# Patient Record
Sex: Male | Born: 1960 | Race: White | Hispanic: No
Health system: Midwestern US, Community
[De-identification: ages and names within clinical notes are randomized; demographics above are authoritative.]

## PROBLEM LIST (undated history)

## (undated) DIAGNOSIS — N289 Disorder of kidney and ureter, unspecified: Secondary | ICD-10-CM

## (undated) DIAGNOSIS — M1990 Unspecified osteoarthritis, unspecified site: Secondary | ICD-10-CM

## (undated) DIAGNOSIS — C801 Malignant (primary) neoplasm, unspecified: Secondary | ICD-10-CM

## (undated) DIAGNOSIS — E119 Type 2 diabetes mellitus without complications: Secondary | ICD-10-CM

## (undated) DIAGNOSIS — K859 Acute pancreatitis without necrosis or infection, unspecified: Secondary | ICD-10-CM

## (undated) DIAGNOSIS — K5792 Diverticulitis of intestine, part unspecified, without perforation or abscess without bleeding: Secondary | ICD-10-CM

## (undated) HISTORY — PX: APPENDECTOMY: SHX54

---

## 2008-07-14 NOTE — ED Provider Notes (Unsigned)
PATIENT NAME                  PA #             MR #                  Adam Thompson, Adam Thompson                  [id number]       [id number]            EMERGENCY ROOM PHYSICIAN                 ADM DATE                     Achille Rich, MD                     07/14/2008                   DATE OF BIRTH    AGE            PATIENT TYPE      RM #               [birth year]        47            ERK                                     CHIEF COMPLAINT:  Back pain.       HISTORY OF PRESENT ILLNESS:  A 48 year old male presents to the emergency  room complaining of pain in his lower back.  Patient states he had a long  history of sciatica.  Does not have any recent injury.  Denies any bladder or  bowel incontinence.  Denies any other complaints.       PAST MEDICAL HISTORY:  As noted above.       ALLERGIES:  CODEINE, DARVOCET.       PHYSICAL EXAMINATION:    GENERAL APPEARANCE:  Alert male.    BACK:  Mild paraspinous lumbar pain.  No pain to palpation in the midline.    EXTREMITIES:  Deep tendon reflexes including patellar and Achilles 2/4+,  equal bilaterally.    NEUROLOGICAL:  The patients gait is normal.       IMPRESSION:  Low back pain.       PLAN:  Percocet for pain.  Patient is to follow up with family doctor as  needed.                                  Achille Rich, MD     Adam Thompson [place] /[id number]  DD: 07/14/2008 12:24  DT: 07/15/2008 14:18  Job #: [id number]  CC:

## 2008-07-20 NOTE — ED Provider Notes (Unsigned)
PATIENT NAME                  PA #             MR #                  Adam Thompson, Adam Thompson                  [id number]       [id number]            EMERGENCY ROOM PHYSICIAN                 ADM DATE                     Chesley Mires, MD                        07/20/2008                   DATE OF BIRTH    AGE            PATIENT TYPE      RM #               25-May-1960        47            ERK                                     CHIEF COMPLAINT:  Back pain.       HISTORY OF PRESENT ILLNESS:  This 48 year old presents to the ER complaining  of lower back pain.  He was seen here a week ago with the same complaints.   He said he lives in [place].  He is here on a job for about another  week.  He has pain in his back intermittently.  He is not on any medicine  chronically, he claims.  He is out of the Percocet he was given here last  week and he requested more pain medication.  He complains of lower back pain  without radiation.  No bladder or bowel dysfunction.  No recent history of  trauma.       PHYSICAL EXAMINATION:    GENERAL APPEARANCE:  Objective exam revealed a 48 year old in no distress.    VITAL SIGNS:  Blood pressure 137/90, pulse 77, respirations 20, temperature  97.2.    HEENT:  Normal.    CHEST:  Clear.    HEART:  Regular.    BACK:  He complained of pain slightly to the left side of his lower lumbar  spine.    EXTREMITIES:  On lower extremity exam, deep tendon reflexes all 2+ and  symmetric.  Motor exam is normal.  Straight leg raising was negative.       IMPRESSION:  Exacerbation of chronic back pain.       PLAN:  He will be discharged with a prescription for #20 Lortab.  He was to  follow up with his family doctor.  I told him we could no longer prescribe  him medications out of the emergency room for this problem, and he needed to  follow up with a family doctor.  Chesley Mires, MD     KN /[id number]  DD: 07/20/2008 08:49  DT: 07/21/2008 17:20  Job #: [id number]  CC:

## 2011-08-19 ENCOUNTER — Emergency Department: Payer: Self-pay | Admitting: Emergency Medicine

## 2011-08-19 LAB — COMPREHENSIVE METABOLIC PANEL
Anion Gap: 5 — ABNORMAL LOW (ref 7–16)
Calcium, Total: 9.8 mg/dL (ref 8.5–10.1)
Chloride: 106 mmol/L (ref 98–107)
Co2: 33 mmol/L — ABNORMAL HIGH (ref 21–32)
Creatinine: 0.88 mg/dL (ref 0.60–1.30)
Osmolality: 285 (ref 275–301)
Potassium: 3.4 mmol/L — ABNORMAL LOW (ref 3.5–5.1)
Sodium: 144 mmol/L (ref 136–145)
Total Protein: 7.2 g/dL (ref 6.4–8.2)

## 2011-08-19 LAB — TROPONIN I: Troponin-I: <0.02 ng/mL

## 2011-08-19 LAB — URINALYSIS, COMPLETE
Bilirubin,UR: NEGATIVE
Blood: NEGATIVE
Leukocyte Esterase: NEGATIVE
Nitrite: NEGATIVE
Ph: 5 (ref 4.5–8.0)
Protein: NEGATIVE
RBC,UR: NONE SEEN /HPF (ref 0–5)

## 2011-08-19 LAB — CBC
HCT: 39.1 % — ABNORMAL LOW (ref 40.0–52.0)
MCH: 33.3 pg (ref 26.0–34.0)
MCHC: 34.1 g/dL (ref 32.0–36.0)
Platelet: 191 10*3/uL (ref 150–440)
RBC: 4 10*6/uL — ABNORMAL LOW (ref 4.40–5.90)

## 2011-08-19 LAB — LIPASE, BLOOD: Lipase: 130 U/L (ref 73–393)

## 2011-08-22 ENCOUNTER — Emergency Department: Payer: Self-pay | Admitting: *Deleted

## 2011-08-22 LAB — COMPREHENSIVE METABOLIC PANEL
Albumin: 3.7 g/dL (ref 3.4–5.0)
Alkaline Phosphatase: 61 U/L (ref 50–136)
Anion Gap: 7 (ref 7–16)
Co2: 30 mmol/L (ref 21–32)
Creatinine: 0.9 mg/dL (ref 0.60–1.30)
EGFR (African American): >60
Glucose: 117 mg/dL — ABNORMAL HIGH (ref 65–99)
Osmolality: 284 (ref 275–301)
SGOT(AST): 52 U/L — ABNORMAL HIGH (ref 15–37)
SGPT (ALT): 60 U/L
Sodium: 142 mmol/L (ref 136–145)
Total Protein: 6.5 g/dL (ref 6.4–8.2)

## 2011-08-22 LAB — CBC
HCT: 37.8 % — ABNORMAL LOW (ref 40.0–52.0)
HGB: 13.1 g/dL (ref 13.0–18.0)
MCHC: 34.5 g/dL (ref 32.0–36.0)
MCV: 98 fL (ref 80–100)
RBC: 3.87 10*6/uL — ABNORMAL LOW (ref 4.40–5.90)
WBC: 6.2 10*3/uL (ref 3.8–10.6)

## 2011-08-22 LAB — TROPONIN I: Troponin-I: <0.02 ng/mL

## 2013-02-22 ENCOUNTER — Emergency Department: Payer: Self-pay | Admitting: Emergency Medicine

## 2013-02-22 LAB — CBC WITH DIFFERENTIAL/PLATELET
Basophil #: 0.1 10*3/uL (ref 0.0–0.1)
Basophil %: 0.9 %
Eosinophil %: 4.2 %
MCHC: 33.3 g/dL (ref 32.0–36.0)
MCV: 95 fL (ref 80–100)
Monocyte #: 0.5 x10 3/mm (ref 0.2–1.0)
Monocyte %: 8.8 %
Neutrophil #: 3 10*3/uL (ref 1.4–6.5)
Neutrophil %: 51.5 %
Platelet: 201 10*3/uL (ref 150–440)
RBC: 4.21 10*6/uL — ABNORMAL LOW (ref 4.40–5.90)
RDW: 13.6 % (ref 11.5–14.5)
WBC: 5.9 10*3/uL (ref 3.8–10.6)

## 2013-02-22 LAB — URINALYSIS, COMPLETE
Bacteria: NONE SEEN
Blood: NEGATIVE
Leukocyte Esterase: NEGATIVE
Ph: 6 (ref 4.5–8.0)
RBC,UR: NONE SEEN /HPF (ref 0–5)
Specific Gravity: 1.006 (ref 1.003–1.030)
WBC UR: NONE SEEN /HPF (ref 0–5)

## 2013-02-22 LAB — COMPREHENSIVE METABOLIC PANEL
Albumin: 4 g/dL (ref 3.4–5.0)
Alkaline Phosphatase: 65 U/L
Anion Gap: 3 — ABNORMAL LOW (ref 7–16)
BUN: 8 mg/dL (ref 7–18)
Bilirubin,Total: 0.4 mg/dL (ref 0.2–1.0)
Chloride: 106 mmol/L (ref 98–107)
Co2: 30 mmol/L (ref 21–32)
Creatinine: 0.82 mg/dL (ref 0.60–1.30)
EGFR (African American): >60
EGFR (Non-African Amer.): >60
Osmolality: 276 (ref 275–301)
Potassium: 3.7 mmol/L (ref 3.5–5.1)
SGOT(AST): 58 U/L — ABNORMAL HIGH (ref 15–37)
Total Protein: 7.6 g/dL (ref 6.4–8.2)

## 2013-02-22 LAB — LIPASE, BLOOD: Lipase: 180 U/L (ref 73–393)

## 2013-05-02 IMAGING — CT CT ABD-PELV W/ CM
1 of 2 series · 15 of 32 positions shown, 19 images · IV contrast (isovue)
Comparison: none

REASON FOR EXAM: (1) right abd  pain  vomiting yest better today; (2) same
COMMENTS:

PROCEDURE:     CT  - CT ABDOMEN / PELVIS  W  - August 19, 2011 [DATE]
RESULT:     Comparison:  None
TECHNIQUE: Multiple axial images of the abdomen and pelvis were performed
from the lung bases to the pubic symphysis, with p.o. contrast and with 100
ml of Isovue 300 intravenous contrast.

[Series 2: 3mm soft tissue · axial · 0.65mm/px · z∈[-941,-545]mm · 15 of 146 slices shown, 19 images]
[im 7/146  soft-tissue]
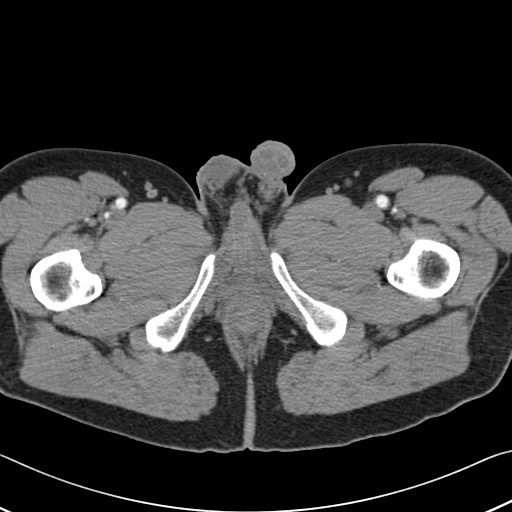
[im 7/146  bone]
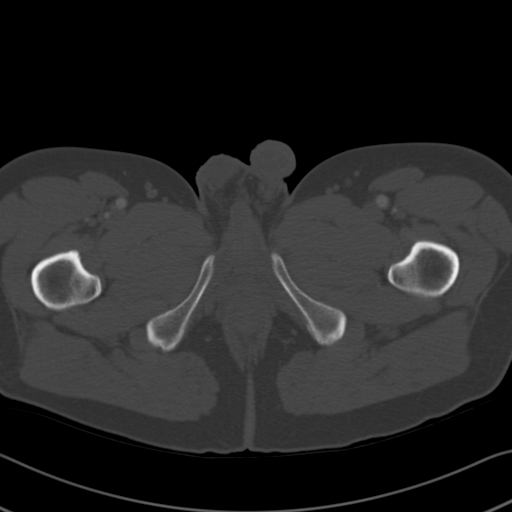
[im 19/146  soft-tissue]
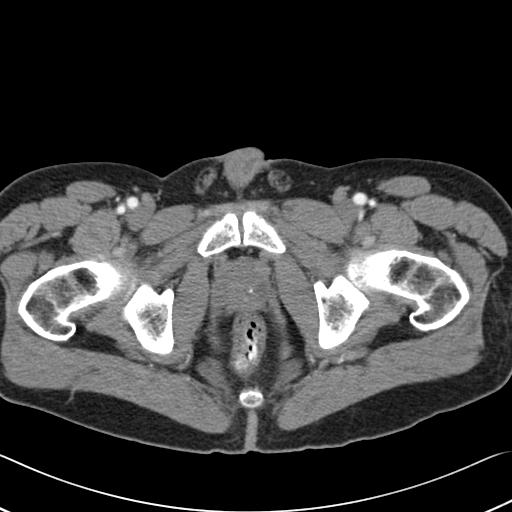
[im 31/146  soft-tissue]
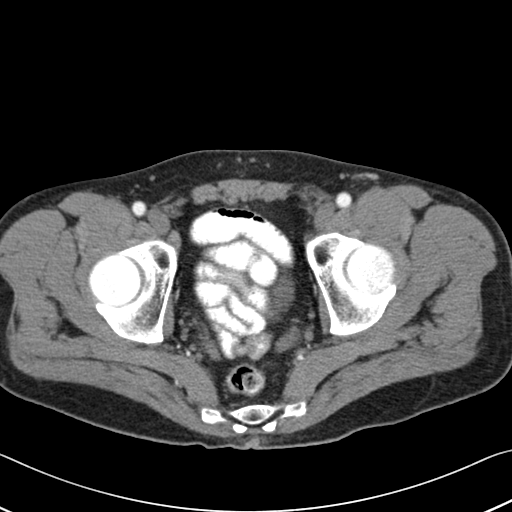
[im 43/146  soft-tissue]
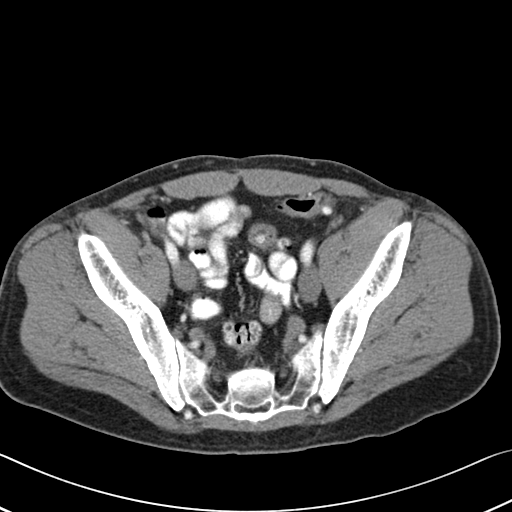
[im 49/146  soft-tissue]
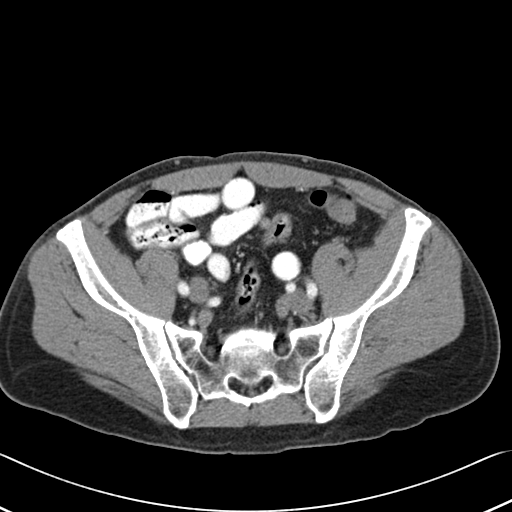
[im 61/146  soft-tissue]
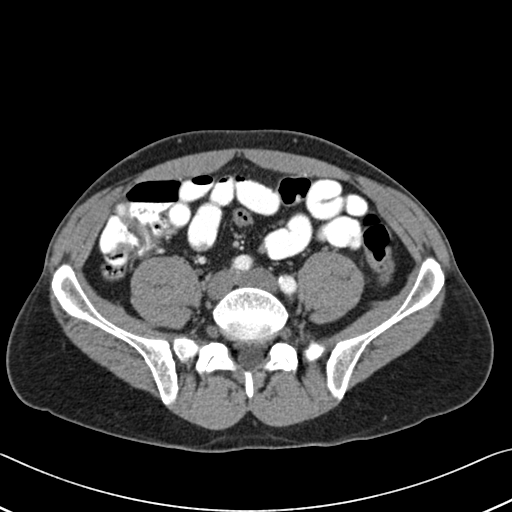
[im 73/146  soft-tissue]
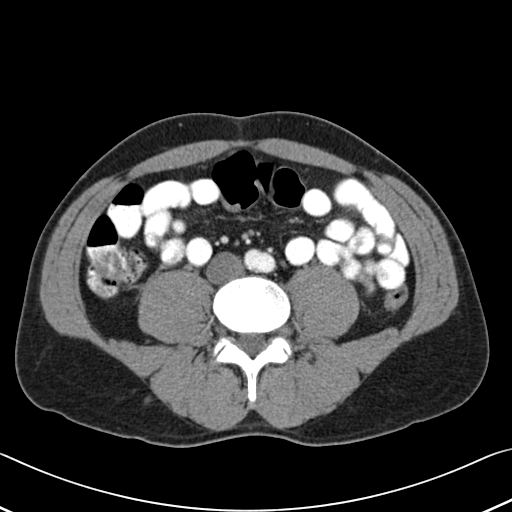
[im 85/146  soft-tissue]
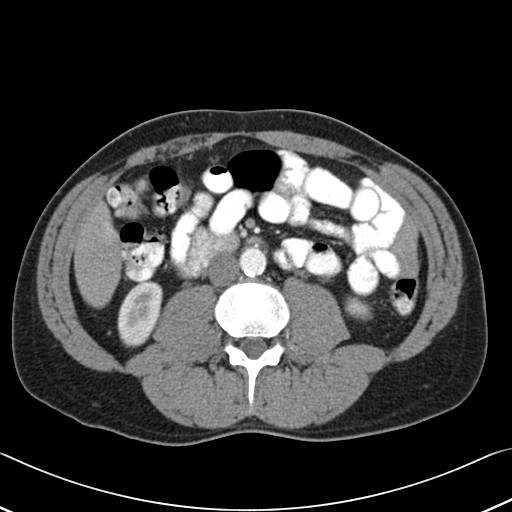
[im 97/146  soft-tissue]
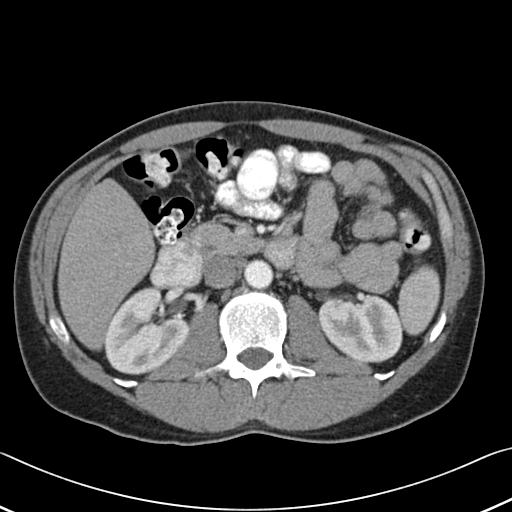
[im 97/146  bone]
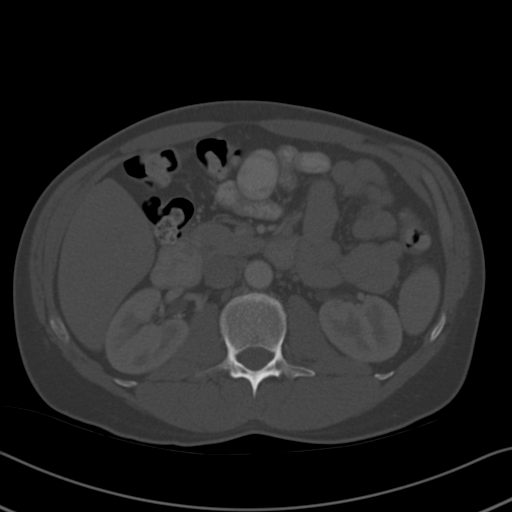
[im 103/146  soft-tissue]
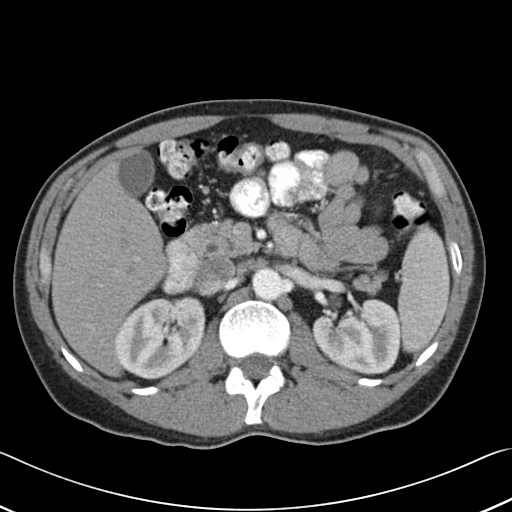
[im 115/146  soft-tissue]
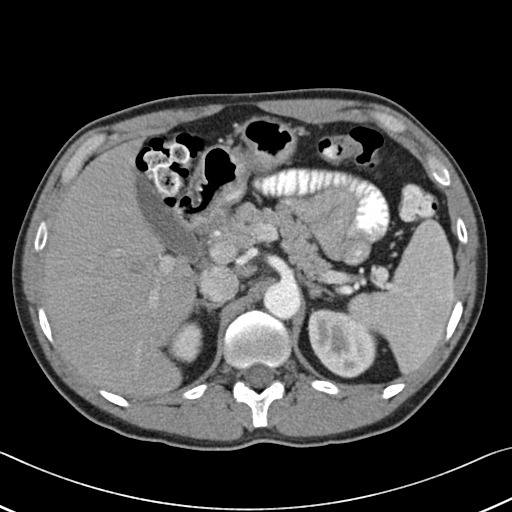
[im 121/146  lung]
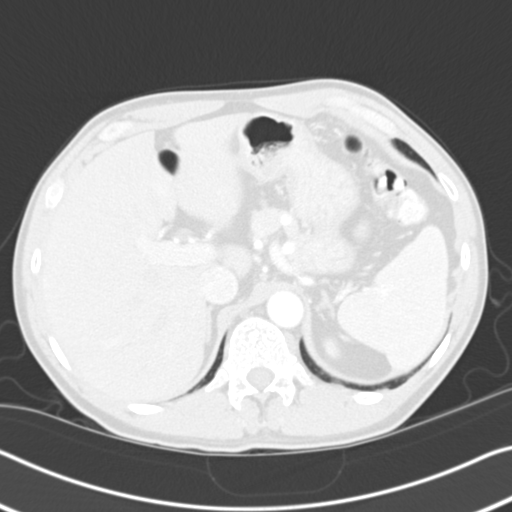
[im 127/146  soft-tissue]
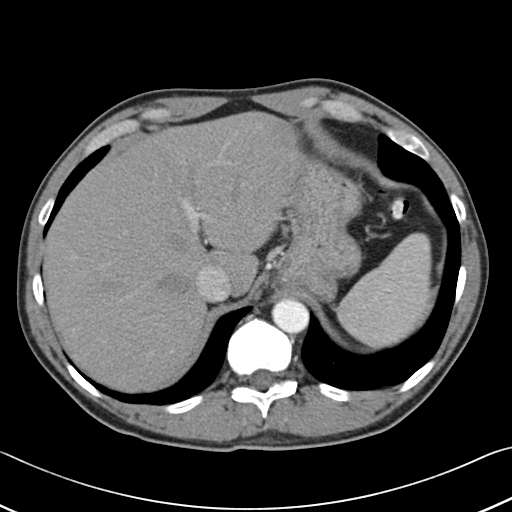
[im 127/146  lung]
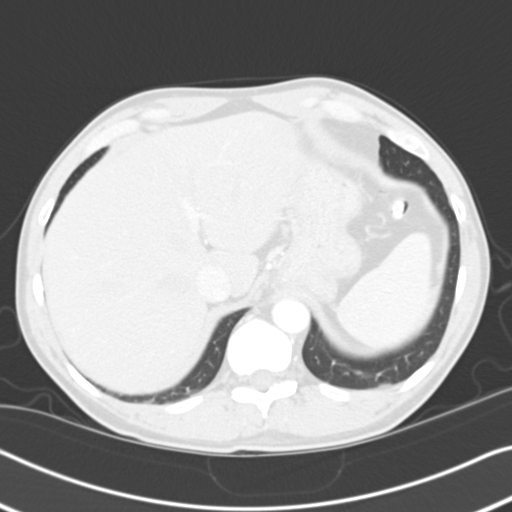
[im 133/146  lung]
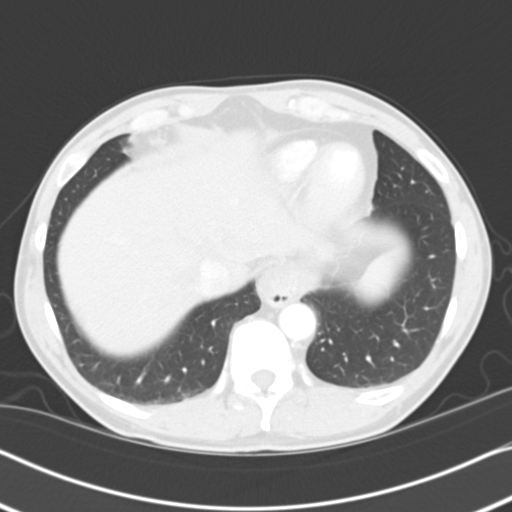
[im 139/146  soft-tissue]
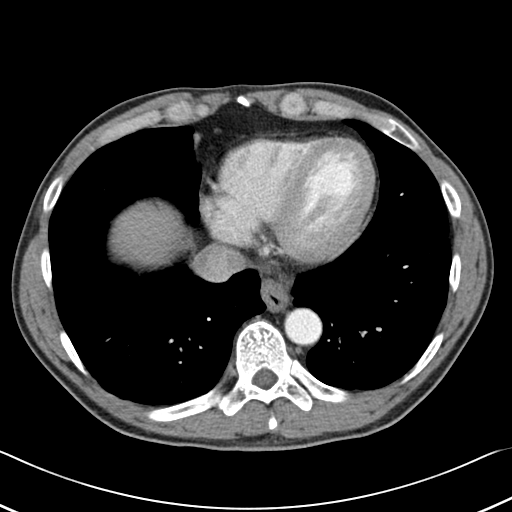
[im 139/146  lung]
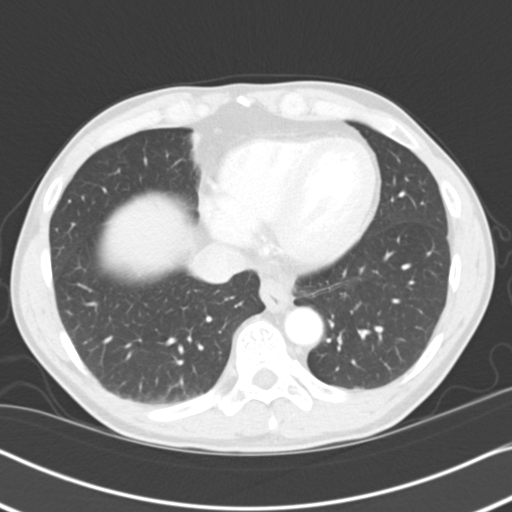

[15 of 32 positions shown; findings below may reference images not displayed]

FINDINGS: The lung bases are clear. There is no pneumothorax. The heart size is
normal.

The liver demonstrates no focal abnormality. There is no intrahepatic or
extrahepatic biliary ductal dilatation. The gallbladder is unremarkable. The
spleen demonstrates no focal abnormality. The kidneys, adrenal glands, and
pancreas are normal. The bladder is unremarkable.

The stomach, duodenum, small intestine, and large intestine demonstrate no
contrast extravasation or dilatation.  There is no pneumoperitoneum,
pneumatosis, or portal venous gas. There is no abdominal or pelvic free
fluid. There is no lymphadenopathy.

The abdominal aorta is normal in caliber .

The osseous structures are unremarkable.
IMPRESSION: 1. No acute abdominal or pelvic pathology.

[REDACTED]

## 2017-01-11 DIAGNOSIS — N433 Hydrocele, unspecified: Secondary | ICD-10-CM

## 2017-01-11 NOTE — ED Provider Notes (Signed)
THE [facility]  EMERGENCY DEPARTMENT ENCOUNTER          ATTENDING PHYSICIAN NOTE       Date of evaluation: 01/11/2017    Chief Complaint     Scrotal Pain (states right testicle is larger than left and painful states)      History of Present Illness     Adam Thompson is a 56 y.o. male who presents to the emergency department with right testicular pain.  Patient states he has been having tenderness to the testicle starting several hours ago.  Patient denies any urinary symptoms.  He denies having anything like this in the past.  He was seen at [facility] and had a negative urinalysis.  He was transferred to this facility to obtain ultrasound to rule out testicular torsion.  On arrival, the patient does complain of continuing pain in the right testicle.    Review of Systems     Review of Systems   Constitutional: Negative.    HENT: Negative.    Eyes: Negative.    Respiratory: Negative.    Cardiovascular: Negative.    Gastrointestinal: Negative.    Genitourinary: Positive for scrotal swelling and testicular pain.   Musculoskeletal: Negative.    Neurological: Negative.    All other systems reviewed and are negative.      Past Medical, Surgical, Family, and Social History     He has a past medical history of Arthritis; Colitis; Diverticulitis; Mesothelioma (HCC); and Pancreatitis.  He has a past surgical history that includes Appendectomy.  His family history is not on file.  He reports that he has been smoking.  He does not have any smokeless tobacco history on file. He reports that he drinks alcohol. He reports that he uses drugs, including Marijuana.    Medications     Discharge Medication List as of 01/12/2017  1:30 AM          Allergies     He has No Known Allergies.    Physical Exam     INITIAL VITALS: BP: (!) 183/103, Temp: 98.6 ??F (37 ??C), Pulse: 87, Resp: 12, SpO2: 100 %   Physical Exam   Constitutional: He appears well-developed and well-nourished. No distress.   Abdominal: Soft. Bowel sounds are  normal. There is no tenderness. There is no rebound and no guarding.   Genitourinary: Penis normal. Right testis shows swelling and tenderness. Cremasteric reflex is not absent on the right side.   Genitourinary Comments: Patient does have mild erythema and swelling to the scrotum.  The right testicle is tender but is normal in lie.   Nursing note and vitals reviewed.      Diagnostic Results     RADIOLOGY:  US SCROTUM AND TESTICLES   Final Result     1.  No evidence of testicular torsion.     2.  Findings likely represent right-sided epididymoorchitis.     3.  Moderate complex right hydrocele          [place] DUP ABD PEL RETRO SCROT COMPLETE    (Results Pending)       LABS:   Results for orders placed or performed during the hospital encounter of 01/11/17   Urine Reflex to Culture   Result Value Ref Range    Color, UA Yellow Straw/Yellow    Clarity, UA Clear Clear    Glucose, Ur Negative Negative mg/dL    Bilirubin Urine Negative Negative    Ketones, Urine Negative Negative mg/dL  Specific Gravity, UA <=1.005 1.005 - 1.030    Blood, Urine Negative Negative    pH, UA 6.5 5.0 - 8.0    Protein, UA Negative Negative mg/dL    Urobilinogen, Urine 0.2 <2.0 E.U./dL    Nitrite, Urine Negative Negative    Leukocyte Esterase, Urine Negative Negative    Microscopic Examination Not Indicated     Urine Reflex to Culture Not Indicated     Urine Type Not Specified      RECENT VITALS:  BP: 130/70,Temp: 98.6 ??F (37 ??C), Pulse: 72, Resp: 18, SpO2: 100 %     Procedures     N/A    ED Course     Nursing Notes, Past Medical Hx, Past Surgical Hx, Social Hx,Allergies, and Family Hx were reviewed.    The patient was given the following medications:  Orders Placed This Encounter   Medications   ??? traMADol (ULTRAM) tablet 50 mg   ??? acetaminophen (TYLENOL) tablet 1,000 mg   ??? oxyCODONE (ROXICODONE) immediate release tablet 5 mg   ??? ciprofloxacin (CIPRO) tablet 500 mg   ??? ciprofloxacin (CIPRO) 500 MG tablet     Sig: Take 1 tablet by mouth 2 times  daily for 14 days     Dispense:  28 tablet     Refill:  0   ??? HYDROcodone-acetaminophen (NORCO) 5-325 MG per tablet     Sig: Take 1 tablet by mouth every 6 hours as needed for Pain for up to 3 days. Intended supply: 3 days. Take lowest dose possible to manage pain.     Dispense:  12 tablet     Refill:  0       CONSULTS:  None    MEDICAL DECISIONMAKING / ASSESSMENT / PLAN     Adam Thompson is a 56 y.o. male presents to the emergency department complaining of acute onset of right testicular pain and swelling.  Patient does have an erythematous scrotum with testicular tenderness on exam though the testicle is normal in lie.  Urinalysis shows no evidence of infection.  Ultrasound shows no evidence of testicular torsion but does show increased blood flow consistent with epididyoorchitis as well as a moderate complex right hydrocele.  The patient will be treated with Cipro since he states he is not sexually active.  He will be given a small amount of Norco for pain.  He will be instructed to keep scrotum elevated when possible.  He will be instructed to follow-up with the urologist on call for further evaluation and management.    Clinical Impression     1. Orchitis and epididymitis    2. Hydrocele, unspecified hydrocele type        Disposition     PATIENT REFERRED TO:  Sharlette Dense, MD  [address]  [facility]  [place] [address]  [phone]            DISCHARGE MEDICATIONS:  Discharge Medication List as of 01/12/2017  1:30 AM      START taking these medications    Details   ciprofloxacin (CIPRO) 500 MG tablet Take 1 tablet by mouth 2 times daily for 14 days, Disp-28 tablet, R-0Print      HYDROcodone-acetaminophen (NORCO) 5-325 MG per tablet Take 1 tablet by mouth every 6 hours as needed for Pain for up to 3 days. Intended supply: 3 days. Take lowest dose possible to manage pain., Disp-12 tablet,  R-0Print             DISPOSITION Decision To Discharge 01/12/2017 01:29:34  AM        Leota Sauers, MD  01/12/17 [phone]

## 2017-01-11 NOTE — ED Notes (Signed)
Called boss to take to Alvester Morin, RN  01/11/17 [phone]

## 2017-01-11 NOTE — ED Provider Notes (Signed)
[facility] Emergency Department  01/11/17     Patient Identification  Adam Thompson is a 56 y.o. male    Chief Complaint  Scrotal Pain (states right testicle is larger than left and painful states)      Mode of Arrival  private car (dropped off)    HPI  (History provided by patient)  This is a 56 y.o. male without significant past medical history presented today for right scrotal pain and swelling.  Patient states he woke up today with ongoing discomfort in his right scrotum.  He states is a day one on his right scrotum became more swollen and painful.  He states his right testicle is larger than the left now and is usually.  Took numerous nonsteroidal pain medications without relief..  No previous history of hernia and previous history of epididymitis.    ROS  10 systems reviewed and negative except as in HPI    I have reviewed the following nursing documentation:  Allergies: No Known Allergies    Past medical history:  has no past medical history on file.    Past surgical history:  has no past surgical history on file.    Home medications:   Prior to Admission medications    Not on File       Social history:      Family history:  No family history on file.    Exam  ED Triage Vitals [01/11/17 2232]   BP Temp Temp Source Pulse Resp SpO2 Height Weight   (!) 183/103 98.6 F (37 C) Oral 87 12 100 % 5\' 5"  (1.651 m) 142 lb 6.4 oz (64.6 kg)        Nursing notes and vital signs reviewed    Constitutional - patient oriented to person, place, time.  Well-developed well-nourished.  Nontoxic.  In Mild  distress.    HEENT  Head- normocephalic, atraumatic  Eyes-anicteric sclera, PERRL, no discharge  Ears-external canals normal  Throat-clear of exudate, no erythema  Mouth-membranes mucosa moist and pink    Lymphatic-  No significant lymphadenopathy noted in cervical, submandibular, auricular or inguinal regions    Neck-supple, trachea midline.  No meningismus.    Cardiovascular-regular rate and rhythm, no gallops rubs or murmurs,  2+ radial pulses    Pulmonary/Chest-  effort normal.  No respiratory distress.  Clear to auscultation bilaterally, no stridor, no wheezes, no rales    Abdomen- soft nondistended.  No tenderness.  No rebound or guarding.  Bowel sounds normal.  No masses.    Musculoskeletal- no extremity edema.  Compartments soft.  No deformity.  No tenderness in the extremities.  Back-no tenderness over the bony spine.    Neurologic- alert and oriented to person place and time.  No facial droop.  No slurred speech.  Motor intact in all 4 extremities.  Normal muscle tone.  Gait normal.    Skin- warm and dry, no rash.  No petechiae.    Psychiatric- normal mood and affect.  Genital exam normal male external genitalia with swelling in the right scrotum.  He has a tense swelling in the right scrotum.  They testicle is ill-defined.  It is tender to palpation.  I feel no hernia in the inguinal ring.  Possibility of torsion also possibility of hydrocele or hematocele or epididymitis exists.  Needs ultrasound to rule out torsion        MDM/ED Course  Orders Placed This Encounter   Medications   . traMADol (ULTRAM) tablet 50 mg  Radiology  No results found.        Labs  Results for orders placed or performed during the hospital encounter of 01/11/17   Urine Reflex to Culture   Result Value Ref Range    Color, UA Yellow Straw/Yellow    Clarity, UA Clear Clear    Glucose, Ur Negative Negative mg/dL    Bilirubin Urine Negative Negative    Ketones, Urine Negative Negative mg/dL    Specific Gravity, UA <=1.005 1.005 - 1.030    Blood, Urine Negative Negative    pH, UA 6.5 5.0 - 8.0    Protein, UA Negative Negative mg/dL    Urobilinogen, Urine 0.2 <2.0 E.U./dL    Nitrite, Urine Negative Negative    Leukocyte Esterase, Urine Negative Negative    Microscopic Examination Not Indicated     Urine Reflex to Culture Not Indicated     Urine Type Not Specified        [facility]. We thoroughly discussed the  history, physical exam, laboratory and imaging studies, as well as, emergency department course. Based upon that discussion, we've decided to Transfer the [facility] to [facility] emergency department for emergent ultrasound to rule out torsion.  Dr. Orvan Falconerampbell graciously accepted the patient for transfer    Final Impression    1. Painful scrotum        Blood pressure 130/70, pulse 72, temperature 98.6 F (37 C), temperature source Oral, resp. rate 18, height 5\' 5"  (1.651 m), weight 64.6 kg (142 lb 6.4 oz), SpO2 100 %.    Disposition:  Transfer to [facility] to ED in stable condition.by POV          This chart was generated in part by using Dragon Dictation system and may contain errors related to that system including errors in grammar, punctuation, and spelling, as well as words and phrases that may be inappropriate. If there are any questions or concerns please feel free to contact the dictating provider for clarification.         Erroll Lunaobert B Kaarin Pardy, MD  01/11/17 2312

## 2017-01-11 NOTE — ED Triage Notes (Signed)
States today had pain in right testicle hurts and is larger states did lift paint and climb ladder

## 2017-01-11 NOTE — ED Notes (Signed)
Pt signed emtala and d/c instructions. Packet of info given to pt with instructions on how to get to [facility]     Mahamadou Weltz, RN  01/11/17 2306

## 2017-01-12 ENCOUNTER — Emergency Department: Admit: 2017-01-12 | Primary: Pulmonary Disease

## 2017-01-12 ENCOUNTER — Inpatient Hospital Stay
Admit: 2017-01-12 | Discharge: 2017-01-12 | Disposition: A | Discharge status: Home / Self Care | Attending: Emergency Medicine

## 2017-01-12 LAB — URINALYSIS WITH REFLEX TO CULTURE
Bilirubin Urine: NEGATIVE
Blood, Urine: NEGATIVE
Glucose, Ur: NEGATIVE mg/dL
Ketones, Urine: NEGATIVE mg/dL
Leukocyte Esterase, Urine: NEGATIVE
Nitrite, Urine: NEGATIVE
Protein, UA: NEGATIVE mg/dL
Specific Gravity, UA: <=1.005 (ref 1.005–1.030)
Urobilinogen, Urine: 0.2 E.U./dL (ref <2.0)
pH, UA: 6.5 (ref 5.0–8.0)

## 2017-01-12 MED ORDER — CIPROFLOXACIN HCL 500 MG PO TABS
500 MG | ORAL_TABLET | Freq: Two times a day (BID) | ORAL | 0 refills | Status: AC
Start: 2017-01-12 — End: 2017-01-26

## 2017-01-12 MED ORDER — HYDROCODONE-ACETAMINOPHEN 5-325 MG PO TABS
5-325 MG | ORAL_TABLET | Freq: Four times a day (QID) | ORAL | 0 refills | Status: AC | PRN
Start: 2017-01-12 — End: 2017-01-15

## 2017-01-12 MED ORDER — ACETAMINOPHEN 500 MG PO TABS
500 MG | Freq: Once | ORAL | Status: DC
Start: 2017-01-12 — End: 2017-01-12

## 2017-01-12 MED ORDER — CIPROFLOXACIN HCL 500 MG PO TABS
500 MG | Freq: Once | ORAL | Status: AC
Start: 2017-01-12 — End: 2017-01-12
  Administered 2017-01-12: 06:00:00 500 mg via ORAL

## 2017-01-12 MED ORDER — TRAMADOL HCL 50 MG PO TABS
50 MG | Freq: Once | ORAL | Status: DC
Start: 2017-01-12 — End: 2017-01-12

## 2017-01-12 MED ORDER — OXYCODONE HCL 5 MG PO TABS
5 MG | Freq: Once | ORAL | Status: AC
Start: 2017-01-12 — End: 2017-01-12
  Administered 2017-01-12: 06:00:00 5 mg via ORAL

## 2017-01-12 MED FILL — OXYCODONE HCL 5 MG PO TABS: 5 MG | ORAL | Qty: 1

## 2017-01-12 MED FILL — TYLENOL EXTRA STRENGTH 500 MG PO TABS: 500 MG | ORAL | Qty: 2

## 2017-01-12 MED FILL — CIPROFLOXACIN HCL 500 MG PO TABS: 500 MG | ORAL | Qty: 1

## 2017-01-12 MED FILL — TRAMADOL HCL 50 MG PO TABS: 50 MG | ORAL | Qty: 1

## 2017-01-12 NOTE — ED Notes (Signed)
Dc inst and scripts given to pt , verb understanding.  Dc'd ambulatory to lobby to call for ride to home     Veleta Minersonya D Caily Rakers, RN  01/12/17 [phone]

## 2017-01-12 NOTE — Discharge Instructions (Addendum)
Take antibiotics as prescribed until bottle is empty.  Use ibuprofen as needed for pain, Norco for breakthrough pain.  Keep scrotum elevated when possible.  Follow-up with the urologist on call for repeat evaluation and further management.

## 2017-07-08 ENCOUNTER — Encounter: Payer: Self-pay | Admitting: Emergency Medicine

## 2017-07-08 ENCOUNTER — Other Ambulatory Visit: Payer: Self-pay

## 2017-07-08 ENCOUNTER — Emergency Department
Admission: EM | Admit: 2017-07-08 | Discharge: 2017-07-08 | Disposition: A | Discharge status: Home / Self Care | Payer: Self-pay | Attending: Emergency Medicine | Admitting: Emergency Medicine

## 2017-07-08 DIAGNOSIS — F17213 Nicotine dependence, cigarettes, with withdrawal: Secondary | ICD-10-CM | POA: Insufficient documentation

## 2017-07-08 DIAGNOSIS — G8929 Other chronic pain: Secondary | ICD-10-CM | POA: Insufficient documentation

## 2017-07-08 DIAGNOSIS — Z859 Personal history of malignant neoplasm, unspecified: Secondary | ICD-10-CM | POA: Insufficient documentation

## 2017-07-08 DIAGNOSIS — R109 Unspecified abdominal pain: Secondary | ICD-10-CM | POA: Insufficient documentation

## 2017-07-08 DIAGNOSIS — R112 Nausea with vomiting, unspecified: Secondary | ICD-10-CM | POA: Insufficient documentation

## 2017-07-08 DIAGNOSIS — E119 Type 2 diabetes mellitus without complications: Secondary | ICD-10-CM | POA: Insufficient documentation

## 2017-07-08 DIAGNOSIS — R197 Diarrhea, unspecified: Secondary | ICD-10-CM | POA: Insufficient documentation

## 2017-07-08 DIAGNOSIS — Z590 Homelessness: Secondary | ICD-10-CM | POA: Insufficient documentation

## 2017-07-08 HISTORY — DX: Disorder of kidney and ureter, unspecified: N28.9

## 2017-07-08 HISTORY — DX: Unspecified osteoarthritis, unspecified site: M19.90

## 2017-07-08 HISTORY — DX: Diverticulitis of intestine, part unspecified, without perforation or abscess without bleeding: K57.92

## 2017-07-08 HISTORY — DX: Acute pancreatitis without necrosis or infection, unspecified: K85.90

## 2017-07-08 HISTORY — DX: Type 2 diabetes mellitus without complications: E11.9

## 2017-07-08 HISTORY — DX: Malignant (primary) neoplasm, unspecified: C80.1

## 2017-07-08 LAB — CBC
HEMATOCRIT: 39.5 % — AB (ref 40.0–52.0)
Hemoglobin: 13.7 g/dL (ref 13.0–18.0)
MCH: 34.1 pg — ABNORMAL HIGH (ref 26.0–34.0)
MCHC: 34.7 g/dL (ref 32.0–36.0)
MCV: 98.2 fL (ref 80.0–100.0)
PLATELETS: 177 10*3/uL (ref 150–440)
RBC: 4.02 MIL/uL — ABNORMAL LOW (ref 4.40–5.90)
RDW: 13.7 % (ref 11.5–14.5)
WBC: 5.2 10*3/uL (ref 3.8–10.6)

## 2017-07-08 LAB — COMPREHENSIVE METABOLIC PANEL
ALBUMIN: 4 g/dL (ref 3.5–5.0)
ALT: 55 U/L (ref 17–63)
AST: 52 U/L — ABNORMAL HIGH (ref 15–41)
Alkaline Phosphatase: 56 U/L (ref 38–126)
Anion gap: 6 (ref 5–15)
BILIRUBIN TOTAL: 0.4 mg/dL (ref 0.3–1.2)
BUN: 8 mg/dL (ref 6–20)
CHLORIDE: 106 mmol/L (ref 101–111)
CO2: 30 mmol/L (ref 22–32)
CREATININE: 0.85 mg/dL (ref 0.61–1.24)
Calcium: 9.1 mg/dL (ref 8.9–10.3)
GFR calc Af Amer: >60 mL/min (ref >60)
GLUCOSE: 101 mg/dL — AB (ref 65–99)
POTASSIUM: 3.7 mmol/L (ref 3.5–5.1)
Sodium: 142 mmol/L (ref 135–145)
Total Protein: 7.2 g/dL (ref 6.5–8.1)

## 2017-07-08 LAB — URINALYSIS, COMPLETE (UACMP) WITH MICROSCOPIC
BACTERIA UA: NONE SEEN
Bilirubin Urine: NEGATIVE
Glucose, UA: NEGATIVE mg/dL
Hgb urine dipstick: NEGATIVE
Ketones, ur: NEGATIVE mg/dL
Leukocytes, UA: NEGATIVE
Nitrite: NEGATIVE
PH: 6 (ref 5.0–8.0)
Protein, ur: NEGATIVE mg/dL
SPECIFIC GRAVITY, URINE: 1.015 (ref 1.005–1.030)
SQUAMOUS EPITHELIAL / LPF: NONE SEEN (ref 0–5)

## 2017-07-08 LAB — LIPASE, BLOOD: LIPASE: 44 U/L (ref 11–51)

## 2017-07-08 MED ORDER — OXYCODONE-ACETAMINOPHEN 5-325 MG PO TABS
2.0000 | ORAL_TABLET | Freq: Once | ORAL | Status: AC
  Administered 2017-07-08: 2 via ORAL
  Filled 2017-07-08: qty 2

## 2017-07-08 MED ORDER — FAMOTIDINE 20 MG PO TABS: 20.0000 mg | ORAL_TABLET | Freq: Two times a day (BID) | ORAL | 1 refills | Status: DC

## 2017-07-08 MED ORDER — ONDANSETRON 4 MG PO TBDP
4.0000 mg | ORAL_TABLET | Freq: Once | ORAL | Status: AC
Start: 2017-07-08 — End: 2017-07-08
  Administered 2017-07-08: 4 mg via ORAL
  Filled 2017-07-08: qty 1

## 2017-07-08 MED ORDER — ONDANSETRON 4 MG PO TBDP: 4.0000 mg | ORAL_TABLET | Freq: Three times a day (TID) | ORAL | 0 refills | Status: DC | PRN

## 2017-07-08 NOTE — ED Triage Notes (Signed)
Pt to ED via POV c/o abdominal pain x 9-10 days. Pt states that he has had nausea and some vomiting and diarrhea. Pt states that he has hx/o pancreatitis. Pt is in NAD at this time.

## 2017-07-08 NOTE — ED Notes (Signed)
Pt pacing in the lobby, seen to be talking to himself

## 2017-07-08 NOTE — ED Provider Notes (Signed)
[facility] Emergency Department Provider Note       Time seen: ----------------------------------------- 3:57 PM on 07/08/2017 -----------------------------------------   I have reviewed the triage vital signs and the nursing notes.  HISTORY   Chief Complaint Abdominal Pain    HPI Adam Stevenson is a 57 y.o. male with a history of arthritis, cancer, diabetes, diverticulitis, pancreatitis who presents to the ED for abdominal pain for the last 9 to 10 days.  Patient states he has had some nausea and some vomiting and diarrhea, states that it feels like pancreatitis that has had in the past.  Patient states he is currently homeless and trying to make his way to [place].  Was seen in [place] and diagnosed with chronic pancreatitis and encouraged to follow-up with a primary care doctor  Past Medical History:  Diagnosis Date  . Arthritis   . Cancer ([place])   . Diabetes mellitus without complication ([place])   . Diverticulitis   . Pancreatitis   . Renal disorder     There are no active problems to display for this patient.   Past Surgical History:  Procedure Laterality Date  . APPENDECTOMY      Allergies Toradol [ketorolac tromethamine] and Tramadol  Social History Social History   Tobacco Use  . Smoking status: Current Every Day Smoker  . Smokeless tobacco: Never Used  Substance Use Topics  . Alcohol use: Yes    Alcohol/week: 1.2 - 7.2 oz    Types: 2 - 12 Cans of beer per week  . Drug use: Not Currently   Review of Systems Constitutional: Negative for fever. Cardiovascular: Negative for chest pain. Respiratory: Negative for shortness of breath. Gastrointestinal: Positive for abdominal pain, vomiting and diarrhea Musculoskeletal: Negative for back pain. Skin: Negative for rash. Neurological: Negative for headaches, focal weakness or numbness.  All systems negative/normal/unremarkable except as stated in the  HPI  ____________________________________________   PHYSICAL EXAM:  VITAL SIGNS: ED Triage Vitals  Enc Vitals Group     BP 07/08/17 1525 (!) 169/101     Pulse Rate 07/08/17 1524 68     Resp 07/08/17 1524 16     Temp 07/08/17 1524 98.9 F (37.2 C)     Temp Source 07/08/17 1524 Oral     SpO2 07/08/17 1524 95 %     Weight 07/08/17 1525 150 lb (68 kg)     Height 07/08/17 1525 5\' 5"  (1.651 m)     Head Circumference --      Peak Flow --      Pain Score 07/08/17 1525 8     Pain Loc --      Pain Edu? --      Excl. in [place]? --    Constitutional: Alert and oriented. Well appearing and in no distress. Eyes: Conjunctivae are normal. Normal extraocular movements. ENT   Head: Normocephalic and atraumatic.   Nose: No congestion/rhinnorhea.   Mouth/Throat: Mucous membranes are moist.   Neck: No stridor. Cardiovascular: Normal rate, regular rhythm. No murmurs, rubs, or gallops. Respiratory: Normal respiratory effort without tachypnea nor retractions. Breath sounds are clear and equal bilaterally. No wheezes/rales/rhonchi. Gastrointestinal: Epigastric tenderness, no rebound or guarding.  Normal bowel sounds. Musculoskeletal: Nontender with normal range of motion in extremities. No lower extremity tenderness nor edema. Neurologic:  Normal speech and language. No gross focal neurologic deficits are appreciated.  Skin:  Skin is warm, dry and intact. No rash noted. Psychiatric: Mood and affect are normal. Speech and behavior are  normal.  ____________________________________________  ED COURSE:  As part of my medical decision making, I reviewed the following data within the [place] History obtained from family if available, nursing notes, old chart and ekg, as well as notes from prior ED visits. Patient presented for abdominal pain as well as vomiting and diarrhea, we will assess with labs and imaging as indicated at this time.    Procedures ____________________________________________   LABS (pertinent positives/negatives)  Labs Reviewed  CBC - Abnormal; Notable for the following components:      Result Value   RBC 4.02 (*)    HCT 39.5 (*)    MCH 34.1 (*)    All other components within normal limits  URINALYSIS, COMPLETE (UACMP) WITH MICROSCOPIC - Abnormal; Notable for the following components:   Color, Urine YELLOW (*)    APPearance CLEAR (*)    All other components within normal limits  LIPASE, BLOOD  COMPREHENSIVE METABOLIC PANEL   ____________________________________________  DIFFERENTIAL DIAGNOSIS   Chronic pain, chronic pancreatitis, pancreatitis, gastritis, GERD, peptic ulcer disease  FINAL ASSESSMENT AND PLAN  Chronic abdominal pain   Plan: The patient had presented for abdominal pain. Patient's labs are reassuring.  Patient was given a dose of Percocet as well as Zofran here.  This is likely chronic pain, I will encourage antacids as well as nonnarcotic pain medicine with Zofran.  He is stable for outpatient follow-up.   Laurence Aly, MD   Note: This note was generated in part or whole with voice recognition software. Voice recognition is usually quite accurate but there are transcription errors that can and very often do occur. I apologize for any typographical errors that were not detected and corrected.     Earleen Newport, MD 07/08/17 [phone]

## 2017-09-28 ENCOUNTER — Encounter: Payer: Self-pay | Admitting: Emergency Medicine

## 2017-09-28 ENCOUNTER — Emergency Department
Admission: EM | Admit: 2017-09-28 | Discharge: 2017-09-28 | Disposition: A | Discharge status: Home / Self Care | Payer: Self-pay | Attending: Emergency Medicine | Admitting: Emergency Medicine

## 2017-09-28 DIAGNOSIS — Z79899 Other long term (current) drug therapy: Secondary | ICD-10-CM | POA: Insufficient documentation

## 2017-09-28 DIAGNOSIS — Z765 Malingerer [conscious simulation]: Secondary | ICD-10-CM

## 2017-09-28 DIAGNOSIS — Z859 Personal history of malignant neoplasm, unspecified: Secondary | ICD-10-CM | POA: Insufficient documentation

## 2017-09-28 DIAGNOSIS — K861 Other chronic pancreatitis: Secondary | ICD-10-CM | POA: Insufficient documentation

## 2017-09-28 DIAGNOSIS — Z7289 Other problems related to lifestyle: Secondary | ICD-10-CM | POA: Insufficient documentation

## 2017-09-28 DIAGNOSIS — E119 Type 2 diabetes mellitus without complications: Secondary | ICD-10-CM | POA: Insufficient documentation

## 2017-09-28 DIAGNOSIS — F17200 Nicotine dependence, unspecified, uncomplicated: Secondary | ICD-10-CM | POA: Insufficient documentation

## 2017-09-28 LAB — URINALYSIS, COMPLETE (UACMP) WITH MICROSCOPIC
BILIRUBIN URINE: NEGATIVE
Bacteria, UA: NONE SEEN
GLUCOSE, UA: NEGATIVE mg/dL
HGB URINE DIPSTICK: NEGATIVE
Ketones, ur: NEGATIVE mg/dL
LEUKOCYTES UA: NEGATIVE
Nitrite: NEGATIVE
PH: 5 (ref 5.0–8.0)
Protein, ur: NEGATIVE mg/dL
SPECIFIC GRAVITY, URINE: 1.016 (ref 1.005–1.030)
SQUAMOUS EPITHELIAL / LPF: NONE SEEN (ref 0–5)

## 2017-09-28 LAB — COMPREHENSIVE METABOLIC PANEL
ALT: 62 U/L — AB (ref 0–44)
ANION GAP: 7 (ref 5–15)
AST: 60 U/L — ABNORMAL HIGH (ref 15–41)
Albumin: 4.1 g/dL (ref 3.5–5.0)
Alkaline Phosphatase: 62 U/L (ref 38–126)
BUN: 8 mg/dL (ref 6–20)
CHLORIDE: 107 mmol/L (ref 98–111)
CO2: 27 mmol/L (ref 22–32)
CREATININE: 0.82 mg/dL (ref 0.61–1.24)
Calcium: 9.2 mg/dL (ref 8.9–10.3)
Glucose, Bld: 168 mg/dL — ABNORMAL HIGH (ref 70–99)
Potassium: 3.5 mmol/L (ref 3.5–5.1)
Sodium: 141 mmol/L (ref 135–145)
Total Bilirubin: 0.6 mg/dL (ref 0.3–1.2)
Total Protein: 7.8 g/dL (ref 6.5–8.1)

## 2017-09-28 LAB — CBC
HCT: 44.4 % (ref 40.0–52.0)
Hemoglobin: 15.3 g/dL (ref 13.0–18.0)
MCH: 33.9 pg (ref 26.0–34.0)
MCHC: 34.6 g/dL (ref 32.0–36.0)
MCV: 98.1 fL (ref 80.0–100.0)
PLATELETS: 150 10*3/uL (ref 150–440)
RBC: 4.53 MIL/uL (ref 4.40–5.90)
RDW: 13.1 % (ref 11.5–14.5)
WBC: 5.9 10*3/uL (ref 3.8–10.6)

## 2017-09-28 LAB — LIPASE, BLOOD: LIPASE: 30 U/L (ref 11–51)

## 2017-09-28 MED ORDER — HALOPERIDOL LACTATE 5 MG/ML IJ SOLN
2.5000 mg | Freq: Once | INTRAMUSCULAR | Status: DC
  Filled 2017-09-28: qty 1

## 2017-09-28 MED ORDER — ONDANSETRON HCL 4 MG PO TABS: 4.0000 mg | ORAL_TABLET | Freq: Every day | ORAL | 0 refills | Status: AC | PRN

## 2017-09-28 MED ORDER — KETAMINE HCL 10 MG/ML IJ SOLN: 0.3000 mg/kg | Freq: Once | INTRAMUSCULAR | Status: DC

## 2017-09-28 MED ORDER — SODIUM CHLORIDE 0.9 % IV BOLUS: 1000.0000 mL | Freq: Once | INTRAVENOUS | Status: DC

## 2017-09-28 NOTE — ED Notes (Signed)
MD Rifenbark over to speak with pt at this time.

## 2017-09-28 NOTE — ED Provider Notes (Signed)
[facility] Emergency Department Provider Note  ____________________________________________   First MD Initiated Contact with Patient 09/28/17 1714     (approximate)  I have reviewed the triage vital signs and the nursing notes.   HISTORY  Chief Complaint Abdominal Pain   HPI Adam Stevenson is a 57 y.o. male who self presents to the emergency department with upper abdominal pain for the past 5 days.  Some nausea and vomiting and several loose stools.  His pain is moderate severity throbbing aching radiating to his back.  He says he has a long-standing history of chronic pancreatitis and this feels identical.  He was seen recently in the hospital in [place] where he had unremarkable lab work and was prescribed Percocet which seemed to help.  He is currently homeless and "traveling" and has no plans to stay in one place for any particular length of time.  He has no gastroenterologist and takes no medications on a daily basis to help prevent his pain.  He is able to eat food although it worsens his symptoms.    Past Medical History:  Diagnosis Date  . Arthritis   . Cancer ([place])   . Diabetes mellitus without complication ([place])   . Diverticulitis   . Pancreatitis   . Renal disorder     There are no active problems to display for this patient.   Past Surgical History:  Procedure Laterality Date  . APPENDECTOMY      Prior to Admission medications   Medication Sig Start Date End Date Taking? Authorizing Provider  famotidine (PEPCID) 20 MG tablet Take 1 tablet (20 mg total) by mouth 2 (two) times daily. 07/08/17   Earleen Newport, MD  ondansetron (ZOFRAN ODT) 4 MG disintegrating tablet Take 1 tablet (4 mg total) by mouth every 8 (eight) hours as needed for nausea or vomiting. 07/08/17   Earleen Newport, MD  ondansetron (ZOFRAN) 4 MG tablet Take 1 tablet (4 mg total) by mouth daily as needed. 09/28/17 09/28/18  Darel Hong, MD     Allergies Toradol [ketorolac tromethamine] and Tramadol  No family history on file.  Social History Social History   Tobacco Use  . Smoking status: Current Every Day Smoker  . Smokeless tobacco: Never Used  Substance Use Topics  . Alcohol use: Yes    Alcohol/week: 1.2 - 7.2 oz    Types: 2 - 12 Cans of beer per week  . Drug use: Not Currently    Review of Systems Constitutional: No fever/chills Eyes: No visual changes. ENT: No sore throat. Cardiovascular: Denies chest pain. Respiratory: Denies shortness of breath. Gastrointestinal: Positive for abdominal pain.  Positive for nausea, positive for vomiting.  No diarrhea.  No constipation. Genitourinary: Negative for dysuria. Musculoskeletal: Positive for back pain. Skin: Negative for rash. Neurological: Negative for headaches, focal weakness or numbness.   ____________________________________________   PHYSICAL EXAM:  VITAL SIGNS: ED Triage Vitals  Enc Vitals Group     BP 09/28/17 1603 (!) 173/97     Pulse Rate 09/28/17 1603 73     Resp 09/28/17 1603 18     Temp 09/28/17 1603 98 F (36.7 C)     Temp Source 09/28/17 1603 Oral     SpO2 09/28/17 1603 100 %     Weight 09/28/17 1604 155 lb (70.3 kg)     Height 09/28/17 1604 5\' 5"  (1.651 m)     Head Circumference --      Peak Flow --  Pain Score 09/28/17 1603 8     Pain Loc --      Pain Edu? --      Excl. in [place]? --     Constitutional: Alert and oriented x4 somewhat disheveled although quite well-appearing nontoxic no diaphoresis Eyes: PERRL EOMI. Head: Atraumatic. Nose: No congestion/rhinnorhea. Mouth/Throat: No trismus Neck: No stridor.   Cardiovascular: Normal rate, regular rhythm. Grossly normal heart sounds.  Good peripheral circulation. Respiratory: Normal respiratory effort.  No retractions. Lungs CTAB and moving good air Gastrointestinal: Soft mild diffuse upper abdominal tenderness with no rebound or guarding or pancreatitis Musculoskeletal: No  lower extremity edema   Neurologic:  Normal speech and language. No gross focal neurologic deficits are appreciated. Skin:  Skin is warm, dry and intact. No rash noted. Psychiatric: Mood and affect are normal. Speech and behavior are normal.    ____________________________________________   DIFFERENTIAL includes but not limited to  Chronic pancreatitis, acute pancreatitis, dehydration, drug-seeking behavior ____________________________________________   LABS (all labs ordered are listed, but only abnormal results are displayed)  Labs Reviewed  COMPREHENSIVE METABOLIC PANEL - Abnormal; Notable for the following components:      Result Value   Glucose, Bld 168 (*)    AST 60 (*)    ALT 62 (*)    All other components within normal limits  URINALYSIS, COMPLETE (UACMP) WITH MICROSCOPIC - Abnormal; Notable for the following components:   Color, Urine YELLOW (*)    APPearance CLEAR (*)    All other components within normal limits  LIPASE, BLOOD  CBC    Lab work reviewed by me with no acute disease noted __________________________________________  EKG  ED ECG REPORT I, Darel Hong, the attending physician, personally viewed and interpreted this ECG.  Date: 09/30/2017 EKG Time:  Rate: 68 Rhythm: normal sinus rhythm QRS Axis: Rightward Intervals: normal ST/T Wave abnormalities: normal Narrative Interpretation: no evidence of acute ischemia  ____________________________________________  RADIOLOGY   ____________________________________________   PROCEDURES  Procedure(s) performed: no  Procedures  Critical Care performed: no  ____________________________________________   INITIAL IMPRESSION / ASSESSMENT AND PLAN / ED COURSE  Pertinent labs & imaging results that were available during my care of the patient were reviewed by me and considered in my medical decision making (see chart for details).   As part of my medical decision making, I reviewed the  following data within the [place] History obtained from family if available, nursing notes, old chart and ekg, as well as notes from prior ED visits.  On arrival the patient is actually relatively well-appearing with unremarkable labs although his history is consistent with chronic pancreatitis.  Doubt he will require inpatient admission but we will give a trial of haloperidol for pain and nausea along with IV ketamine for pain and reevaluate.     ----------------------------------------- 5:55 PM on 09/28/2017 -----------------------------------------  The patient declines ketamine and he declines haloperidol for pain and nausea.  He said that if I was not willing to give him morphine, Percocet, or Dilaudid that he would prefer to leave.  He was able to eat and drink.  He is discharged to the community in stable condition and understands he is welcome to return at any point and we will continue his work-up and evaluation. ____________________________________________   FINAL CLINICAL IMPRESSION(S) / ED DIAGNOSES  Final diagnoses:  Chronic pancreatitis, unspecified pancreatitis type ([place])  Drug-seeking behavior      NEW MEDICATIONS STARTED DURING THIS VISIT:  Discharge Medication List as of 09/28/2017  5:54 PM    START taking these medications   Details  ondansetron (ZOFRAN) 4 MG tablet Take 1 tablet (4 mg total) by mouth daily as needed., Starting Wed 09/28/2017, Until Thu 09/28/2018, Print         Note:  This document was prepared using Dragon voice recognition software and may include unintentional dictation errors.     Darel Hong, MD 09/30/17 1355

## 2017-09-28 NOTE — Discharge Instructions (Signed)
Please take your nausea medication as needed for discomfort and follow-up with primary care tomorrow for reevaluation.  Return to the emergency department sooner for any concerns.  It was a pleasure to take care of you today, and thank you for coming to our emergency department.  If you have any questions or concerns before leaving please ask the nurse to grab me and I'm more than happy to go through your aftercare instructions again.  If you were prescribed any opioid pain medication today such as Norco, Vicodin, Percocet, morphine, hydrocodone, or oxycodone please make sure you do not drive when you are taking this medication as it can alter your ability to drive safely.  If you have any concerns once you are home that you are not improving or are in fact getting worse before you can make it to your follow-up appointment, please do not hesitate to call 911 and come back for further evaluation.  Darel Hong, MD  Results for orders placed or performed during the hospital encounter of 09/28/17  Lipase, blood  Result Value Ref Range   Lipase 30 11 - 51 U/L  Comprehensive metabolic panel  Result Value Ref Range   Sodium 141 135 - 145 mmol/L   Potassium 3.5 3.5 - 5.1 mmol/L   Chloride 107 98 - 111 mmol/L   CO2 27 22 - 32 mmol/L   Glucose, Bld 168 (H) 70 - 99 mg/dL   BUN 8 6 - 20 mg/dL   Creatinine, Ser 0.82 0.61 - 1.24 mg/dL   Calcium 9.2 8.9 - 10.3 mg/dL   Total Protein 7.8 6.5 - 8.1 g/dL   Albumin 4.1 3.5 - 5.0 g/dL   AST 60 (H) 15 - 41 U/L   ALT 62 (H) 0 - 44 U/L   Alkaline Phosphatase 62 38 - 126 U/L   Total Bilirubin 0.6 0.3 - 1.2 mg/dL   GFR calc non Af Amer >60 >60 mL/min   GFR calc Af Amer >60 >60 mL/min   Anion gap 7 5 - 15  CBC  Result Value Ref Range   WBC 5.9 3.8 - 10.6 K/uL   RBC 4.53 4.40 - 5.90 MIL/uL   Hemoglobin 15.3 13.0 - 18.0 g/dL   HCT 44.4 40.0 - 52.0 %   MCV 98.1 80.0 - 100.0 fL   MCH 33.9 26.0 - 34.0 pg   MCHC 34.6 32.0 - 36.0 g/dL   RDW 13.1 11.5 -  14.5 %   Platelets 150 150 - 440 K/uL  Urinalysis, Complete w Microscopic  Result Value Ref Range   Color, Urine YELLOW (A) YELLOW   APPearance CLEAR (A) CLEAR   Specific Gravity, Urine 1.016 1.005 - 1.030   pH 5.0 5.0 - 8.0   Glucose, UA NEGATIVE NEGATIVE mg/dL   Hgb urine dipstick NEGATIVE NEGATIVE   Bilirubin Urine NEGATIVE NEGATIVE   Ketones, ur NEGATIVE NEGATIVE mg/dL   Protein, ur NEGATIVE NEGATIVE mg/dL   Nitrite NEGATIVE NEGATIVE   Leukocytes, UA NEGATIVE NEGATIVE   RBC / HPF 0-5 0 - 5 RBC/hpf   WBC, UA 0-5 0 - 5 WBC/hpf   Bacteria, UA NONE SEEN NONE SEEN   Squamous Epithelial / LPF NONE SEEN 0 - 5   Mucus PRESENT    Sperm, UA PRESENT

## 2017-09-28 NOTE — ED Notes (Signed)
Pt refused haldol at this time, pt states "get that doctor over here to speak with me, I am not taking haldol!" Notified MD Rifenbark who will speak with pt.

## 2017-09-28 NOTE — ED Triage Notes (Signed)
Patient presents to the ED with left upper quadrant abdominal pain x 5 days.  Patient reports nausea and some vomiting as well as diarrhea.  Patient reports a "few" episodes of diarrhea in 24 hours and states no vomiting in the past 24 hours.  Patient reports history of pancreatitis and states, "this feels like a flare up."  Patient denies alcohol use.

## 2017-09-28 NOTE — ED Notes (Signed)
Pt reports that he is leaving at this time.

## 2017-09-28 NOTE — ED Notes (Signed)
Pt reports upper abdominal pain for last couple days. Hx of pancreatitis and this feels like a flare up. +nausea, denies V/D or CP/SOB. Pt reports he does not see a GI doctor and "moves around a lot" so doesn't have a PCP.

## 2019-06-10 ENCOUNTER — Other Ambulatory Visit: Payer: Self-pay

## 2019-06-10 ENCOUNTER — Emergency Department: Payer: Self-pay

## 2019-06-10 ENCOUNTER — Encounter: Payer: Self-pay | Admitting: Emergency Medicine

## 2019-06-10 ENCOUNTER — Emergency Department
Admission: EM | Admit: 2019-06-10 | Discharge: 2019-06-10 | Disposition: A | Discharge status: Home / Self Care | Payer: Self-pay | Attending: Student | Admitting: Student

## 2019-06-10 DIAGNOSIS — E119 Type 2 diabetes mellitus without complications: Secondary | ICD-10-CM | POA: Insufficient documentation

## 2019-06-10 DIAGNOSIS — K861 Other chronic pancreatitis: Secondary | ICD-10-CM | POA: Insufficient documentation

## 2019-06-10 DIAGNOSIS — R1011 Right upper quadrant pain: Secondary | ICD-10-CM

## 2019-06-10 DIAGNOSIS — F17210 Nicotine dependence, cigarettes, uncomplicated: Secondary | ICD-10-CM | POA: Insufficient documentation

## 2019-06-10 DIAGNOSIS — Z859 Personal history of malignant neoplasm, unspecified: Secondary | ICD-10-CM | POA: Insufficient documentation

## 2019-06-10 LAB — CBC
HCT: 42.5 % (ref 39.0–52.0)
Hemoglobin: 14.8 g/dL (ref 13.0–17.0)
MCH: 33.3 pg (ref 26.0–34.0)
MCHC: 34.8 g/dL (ref 30.0–36.0)
MCV: 95.5 fL (ref 80.0–100.0)
Platelets: 147 10*3/uL — ABNORMAL LOW (ref 150–400)
RBC: 4.45 MIL/uL (ref 4.22–5.81)
RDW: 12.1 % (ref 11.5–15.5)
WBC: 4.8 10*3/uL (ref 4.0–10.5)
nRBC: 0 % (ref 0.0–0.2)

## 2019-06-10 LAB — URINALYSIS, COMPLETE (UACMP) WITH MICROSCOPIC
Bacteria, UA: NONE SEEN
Bilirubin Urine: NEGATIVE
Glucose, UA: NEGATIVE mg/dL
Hgb urine dipstick: NEGATIVE
Ketones, ur: NEGATIVE mg/dL
Leukocytes,Ua: NEGATIVE
Nitrite: NEGATIVE
Protein, ur: NEGATIVE mg/dL
Specific Gravity, Urine: 1.017 (ref 1.005–1.030)
Squamous Epithelial / LPF: NONE SEEN (ref 0–5)
pH: 5 (ref 5.0–8.0)

## 2019-06-10 LAB — COMPREHENSIVE METABOLIC PANEL
ALT: 84 U/L — ABNORMAL HIGH (ref 0–44)
AST: 77 U/L — ABNORMAL HIGH (ref 15–41)
Albumin: 4.2 g/dL (ref 3.5–5.0)
Alkaline Phosphatase: 53 U/L (ref 38–126)
Anion gap: 9 (ref 5–15)
BUN: 10 mg/dL (ref 6–20)
CO2: 27 mmol/L (ref 22–32)
Calcium: 9.3 mg/dL (ref 8.9–10.3)
Chloride: 105 mmol/L (ref 98–111)
Creatinine, Ser: 0.69 mg/dL (ref 0.61–1.24)
GFR calc Af Amer: >60 mL/min (ref >60)
GFR calc non Af Amer: >60 mL/min (ref >60)
Glucose, Bld: 126 mg/dL — ABNORMAL HIGH (ref 70–99)
Potassium: 3.3 mmol/L — ABNORMAL LOW (ref 3.5–5.1)
Sodium: 141 mmol/L (ref 135–145)
Total Bilirubin: 0.6 mg/dL (ref 0.3–1.2)
Total Protein: 7.3 g/dL (ref 6.5–8.1)

## 2019-06-10 LAB — LIPASE, BLOOD: Lipase: 32 U/L (ref 11–51)

## 2019-06-10 MED ORDER — OXYCODONE-ACETAMINOPHEN 5-325 MG PO TABS: 1.0000 | ORAL_TABLET | Freq: Four times a day (QID) | ORAL | 0 refills | Status: AC | PRN

## 2019-06-10 MED ORDER — SODIUM CHLORIDE 0.9 % IV BOLUS
1000.0000 mL | Freq: Once | INTRAVENOUS | Status: AC
  Administered 2019-06-10: 1000 mL via INTRAVENOUS

## 2019-06-10 MED ORDER — ONDANSETRON HCL 4 MG/2ML IJ SOLN
4.0000 mg | Freq: Once | INTRAMUSCULAR | Status: AC
  Administered 2019-06-10: 14:00:00 4 mg via INTRAVENOUS
  Filled 2019-06-10: qty 2

## 2019-06-10 MED ORDER — MORPHINE SULFATE (PF) 4 MG/ML IV SOLN
4.0000 mg | Freq: Once | INTRAVENOUS | Status: AC
  Administered 2019-06-10: 4 mg via INTRAVENOUS
  Filled 2019-06-10: qty 1

## 2019-06-10 MED ORDER — SODIUM CHLORIDE 0.9% FLUSH: 3.0000 mL | Freq: Once | INTRAVENOUS | Status: DC

## 2019-06-10 MED ORDER — HYDROMORPHONE HCL 1 MG/ML IJ SOLN
1.0000 mg | Freq: Once | INTRAMUSCULAR | Status: AC
  Administered 2019-06-10: 1 mg via INTRAVENOUS
  Filled 2019-06-10: qty 1

## 2019-06-10 NOTE — ED Notes (Signed)
Pt education on prescribed medications given prior to administration. Pt states "as long as they're real drugs it's fine." Asked pt what he means by "real drugs," Pt reports he was given placebo by RN at a previous hospital due to what he implies was diversion. Pt shown tamper evident packaging on morphine and cap on Zofran and seal on IVF for assurance that medicine has not been tampered with. Pt verbalizes understanding.

## 2019-06-10 NOTE — ED Triage Notes (Signed)
Pt to ED via POV c/o upper abd pain. Pt states that this has been going on for 5-6 days. Pt reports that he had a CT done in the past and was told he had possible gallstone. Pt is in NAD.

## 2019-06-10 NOTE — ED Notes (Signed)
Pt asking to see dilaudid packaging as well- tamper evident packaging shown to pt. Pt states "1 mg isn't a lot is it?" Pt educated on medication-verbalizes understanding.

## 2019-06-10 NOTE — ED Notes (Signed)
Pt assisted to toilet, pt asking for doctor.

## 2019-06-10 NOTE — ED Provider Notes (Signed)
[facility] Emergency Department Provider Note  ____________________________________________  Time seen: Approximately 2:34 PM  I have reviewed the triage vital signs and the nursing notes.   HISTORY  Chief Complaint Abdominal Pain    HPI Adam Stevenson is a 59 y.o. male who presents the emergency department complaining of right upper quadrant abdominal pain.  Patient has a history of chronic pancreatitis.  Patient had recently been evaluated in [place], CT scan there was questionable for possible stone in the common bile duct.  Patient states that he is having sharp right upper quadrant pain.  He states that there is one specific area that is tender to touch as well as where his pain originates at.  He states he has been nauseated but no vomiting.  No diarrhea or constipation.  Again, patient has a history of chronic pancreatitis but states that recent CT scan revealed no evidence of pancreatitis.  I reviewed recent encounters at [facility].  Patient had CT scan that was questionable for stone in the common bile duct.  Patient's lab pattern was nonobstructive and they did not pursue further work-up.  Patient been given pain medication and discharged.  CT scan was performed 3 days ago.         Past Medical History:  Diagnosis Date  . Arthritis   . Cancer ([place])   . Diabetes mellitus without complication ([place])   . Diverticulitis   . Pancreatitis   . Renal disorder     There are no problems to display for this patient.   Past Surgical History:  Procedure Laterality Date  . APPENDECTOMY      Prior to Admission medications   Medication Sig Start Date End Date Taking? Authorizing Provider  oxyCODONE-acetaminophen (PERCOCET/ROXICET) 5-325 MG tablet Take 1 tablet by mouth every 6 (six) hours as needed for severe pain. 06/10/19   Cuthriell, Charline Bills, PA-C    Allergies Toradol [ketorolac tromethamine] and Tramadol  No family history on  file.  Social History Social History   Tobacco Use  . Smoking status: Current Every Day Smoker  . Smokeless tobacco: Never Used  Substance Use Topics  . Alcohol use: Yes    Alcohol/week: 2.0 - 12.0 standard drinks    Types: 2 - 12 Cans of beer per week  . Drug use: Not Currently     Review of Systems  Constitutional: No fever/chills Eyes: No visual changes. No discharge ENT: No upper respiratory complaints. Cardiovascular: no chest pain. Respiratory: no cough. No SOB. Gastrointestinal: Positive for right upper quadrant abdominal pain.[place]  Positive nausea, no vomiting.  No diarrhea.  No constipation. Genitourinary: Negative for dysuria. No hematuria Musculoskeletal: Negative for musculoskeletal pain. Skin: Negative for rash, abrasions, lacerations, ecchymosis. Neurological: Negative for headaches, focal weakness or numbness. 10-point ROS otherwise negative.  ____________________________________________   PHYSICAL EXAM:  VITAL SIGNS: ED Triage Vitals [06/10/19 1200]  Enc Vitals Group     BP (!) 152/98     Pulse Rate 86     Resp 16     Temp 98.3 F (36.8 C)     Temp Source Oral     SpO2 98 %     Weight 147 lb (66.7 kg)     Height _0  (1.651 m)     Head Circumference      Peak Flow      Pain Score 9     Pain Loc      Pain Edu?      Excl.  in [place]?      Constitutional: Alert and oriented. Well appearing and in no acute distress. Eyes: Conjunctivae are normal. PERRL. EOMI. Head: Atraumatic. ENT:      Ears:       Nose: No congestion/rhinnorhea.      Mouth/Throat: Mucous membranes are moist.  Neck: No stridor.   Hematological/Lymphatic/Immunilogical: No cervical lymphadenopathy. Cardiovascular: Normal rate, regular rhythm. Normal S1 and S2.  Good peripheral circulation. Respiratory: Normal respiratory effort without tachypnea or retractions. Lungs CTAB. Good air entry to the bases with no decreased or absent breath sounds. Gastrointestinal: No visible external  abdominal findings.  Bowel sounds 4 quadrants.  Soft to palpation all quadrants.  Patient is tender to palpation in the right upper quadrant.  Positive Murphy sign.  No other tenderness to palpation over the abdomen.  No guarding or rigidity. No palpable masses. No distention. No CVA tenderness. Musculoskeletal: Full range of motion to all extremities. No gross deformities appreciated. Neurologic:  Normal speech and language. No gross focal neurologic deficits are appreciated.  Skin:  Skin is warm, dry and intact. No rash noted. Psychiatric: Mood and affect are normal. Speech and behavior are normal. Patient exhibits appropriate insight and judgement.   ____________________________________________   LABS (all labs ordered are listed, but only abnormal results are displayed)  Labs Reviewed  COMPREHENSIVE METABOLIC PANEL - Abnormal; Notable for the following components:      Result Value   Potassium 3.3 (*)    Glucose, Bld 126 (*)    AST 77 (*)    ALT 84 (*)    All other components within normal limits  CBC - Abnormal; Notable for the following components:   Platelets 147 (*)    All other components within normal limits  URINALYSIS, COMPLETE (UACMP) WITH MICROSCOPIC - Abnormal; Notable for the following components:   Color, Urine YELLOW (*)    APPearance CLEAR (*)    All other components within normal limits  LIPASE, BLOOD   ____________________________________________  EKG   ____________________________________________  RADIOLOGY I personally viewed and evaluated these images as part of my medical decision making, as well as reviewing the written report by the radiologist.  US Abdomen Limited RUQ  Result Date: 06/10/2019 CLINICAL DATA:  Upper abdominal pain EXAM: ULTRASOUND ABDOMEN LIMITED RIGHT UPPER QUADRANT COMPARISON:  None. FINDINGS: Gallbladder: No gallstones or wall thickening visualized. There is no pericholecystic fluid. No sonographic Murphy sign noted by  sonographer. Common bile duct: Diameter: 6 mm. No intrahepatic or extrahepatic biliary duct dilatation. Liver: No focal lesion identified. Within normal limits in parenchymal echogenicity. Portal vein is patent on color Doppler imaging with normal direction of blood flow towards the liver. Other: None. IMPRESSION: Study within normal limits. Electronically Signed   By: Lowella Grip III M.D.   On: 06/10/2019 14:42    ____________________________________________    PROCEDURES  Procedure(s) performed:    Procedures    Medications  ondansetron (ZOFRAN) injection 4 mg (4 mg Intravenous Given 06/10/19 1414)  morphine 4 MG/ML injection 4 mg (4 mg Intravenous Given 06/10/19 1414)  sodium chloride 0.9 % bolus 1,000 mL (0 mLs Intravenous Stopped 06/10/19 1454)  HYDROmorphone (DILAUDID) injection 1 mg (1 mg Intravenous Given 06/10/19 1457)     ____________________________________________   INITIAL IMPRESSION / ASSESSMENT AND PLAN / ED COURSE  Pertinent labs & imaging results that were available during my care of the patient were reviewed by me and considered in my medical decision making (see chart for details).  Review of  the [place] CSRS was performed in accordance of the [place] prior to dispensing any controlled drugs.  Clinical Course as of Jun 09 1501  Sun Jun 10, 2019  1353 Patient presents emergency department with right upper quadrant abdominal pain.  Patient has a history of chronic pancreatitis which is consistent with his elevated AST and ALT.  Alk phos, T bili, lipase is reassuring.  Patient had recent imaging in [facility] with a CT scan [place].  Patient had findings that were concerning for possible stone in the common bile duct.  There was no obstructive pattern on labs, patient was discharged with pain medication.  Patient continues to have right upper quadrant abdominal pain with tenderness on exam.  At this time I will order right upper quadrant abdominal ultrasound.  I have  a low suspicion however the patient has an obstructing stone.  I suspect that this is patient's chronic pancreatitis.   [JC]    Clinical Course User Index [JC] Cuthriell, Charline Bills, PA-C          Patient's diagnosis is consistent with chronic pancreatitis, right upper quadrant pain.  Patient presented to the emergency department complaining of right upper quadrant pain.  Patient had been seen at [facility] twice in the last 4 days.  Patient had a CT scan that was concerning for possible stone in the common bile duct.  Patient did not have any obstructive pattern and there was no evidence of acute pancreatitis on imaging.  Patient was given pain medication and discharged.  Patient returns to our emergency department complaining of ongoing pain.  Given that question for possible stone in the common bile duct, patient had right upper quadrant abdominal ultrasound.  No evidence of stone or cholecystitis.  Labs are nonobstructive pattern.  Patient is given ED precautions to return to the ED for any worsening or new symptoms.     ____________________________________________  FINAL CLINICAL IMPRESSION(S) / ED DIAGNOSES  Final diagnoses:  RUQ pain  Chronic pancreatitis, unspecified pancreatitis type ([place])      NEW MEDICATIONS STARTED DURING THIS VISIT:  ED Discharge Orders         Ordered    oxyCODONE-acetaminophen (PERCOCET/ROXICET) 5-325 MG tablet  Every 6 hours PRN     06/10/19 1503              This chart was dictated using voice recognition software/Dragon. Despite best efforts to proofread, errors can occur which can change the meaning. Any change was purely unintentional.    Darletta Moll, PA-C 06/10/19 1504    Lilia Pro., MD 06/11/19 1213

## 2019-06-10 NOTE — ED Notes (Signed)
Pt c/o abdominal pain 9/10 that's been going on for the last week. Pt reports nausea, denies V/D.  Pt aox4, nad noted, bowel sounds present in all four quadrants, abdomen round, soft.

## 2021-02-21 IMAGING — US US ABDOMEN LIMITED
1 series · 14 of 25 positions shown · non-contrast
Comparison: None.

CLINICAL DATA: Upper abdominal pain

EXAM:
ULTRASOUND ABDOMEN LIMITED RIGHT UPPER QUADRANT

[Series 1: us abdomen limited ruq · 14 of 47 slices shown]
[im 1/47]
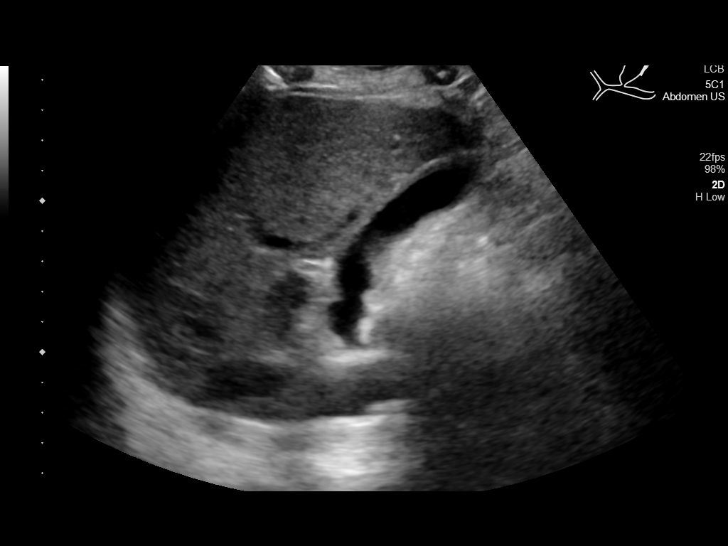
[im 4/47]
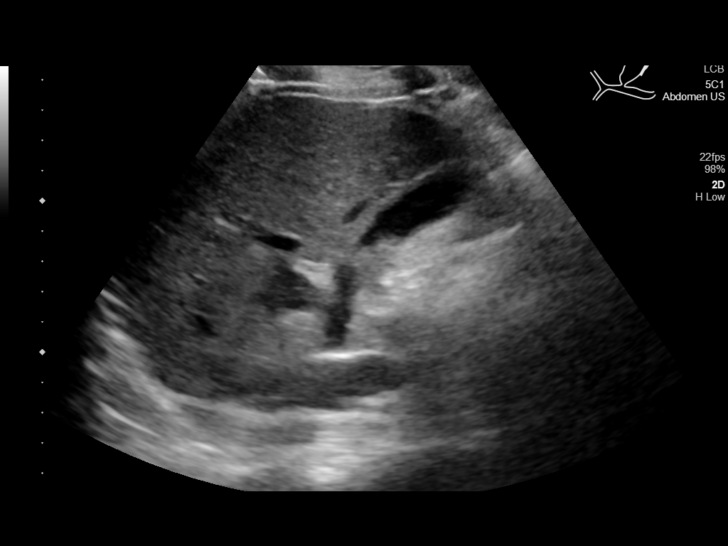
[im 8/47]
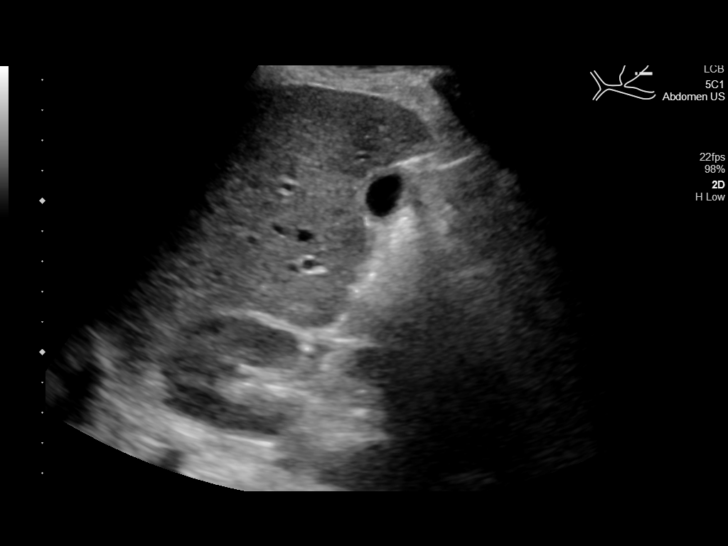
[im 12/47]
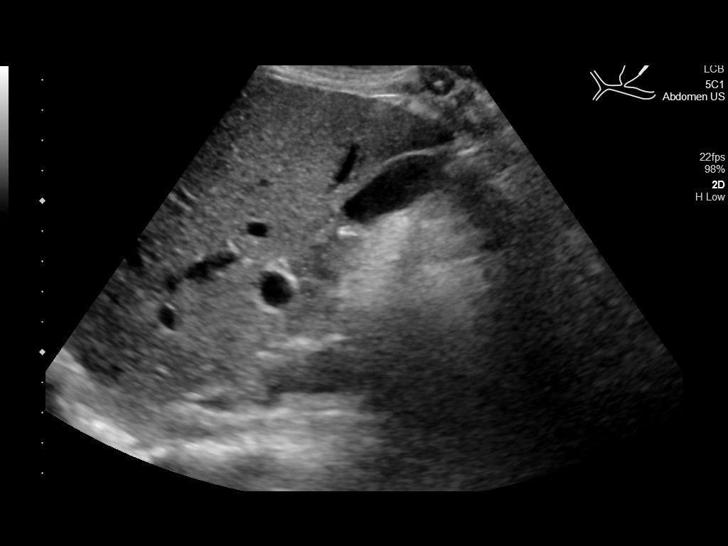
[im 16/47]
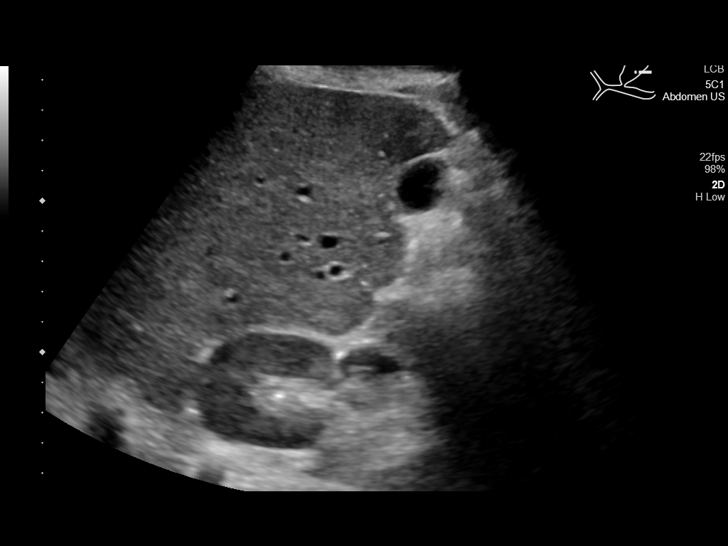
[im 18/47]
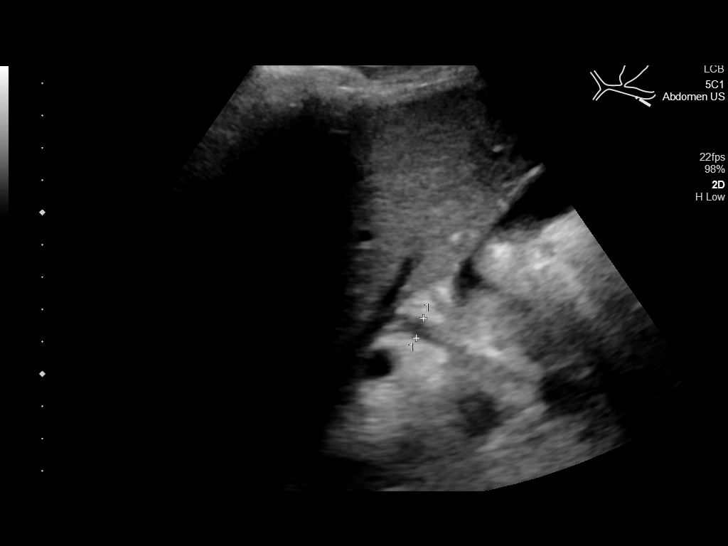
[im 22/47]
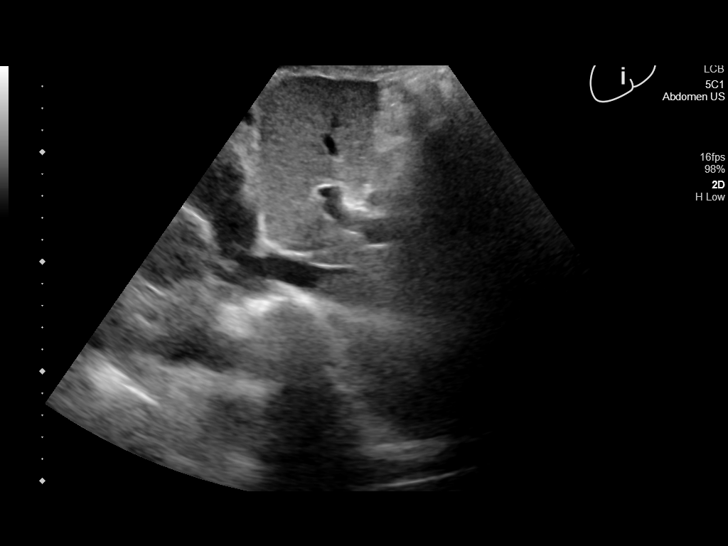
[im 25/47]
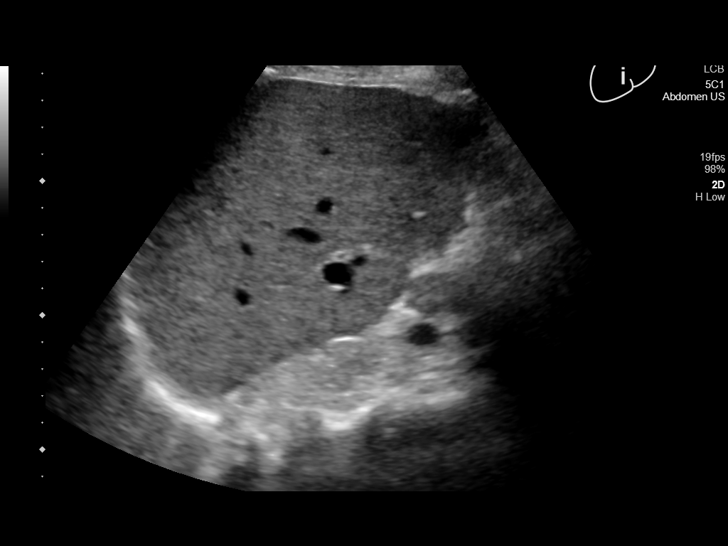
[im 29/47]
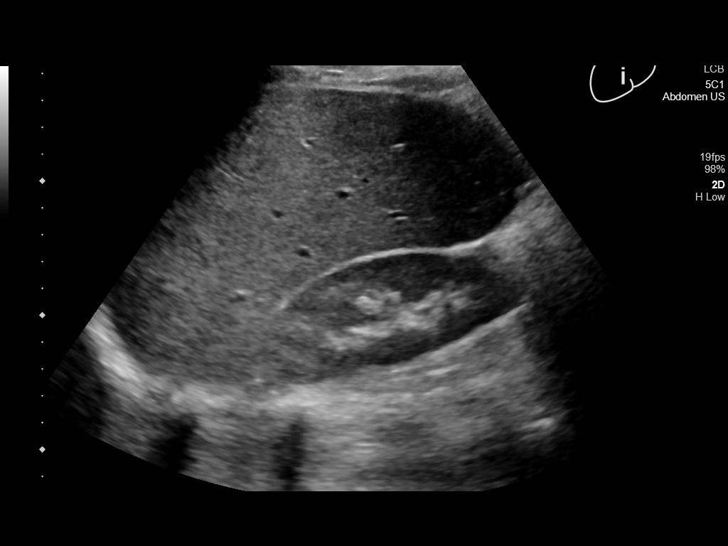
[im 31/47]
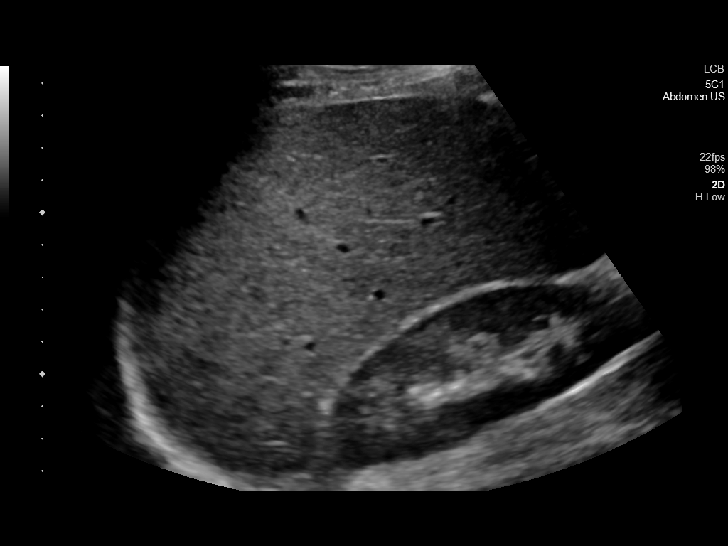
[im 35/47]
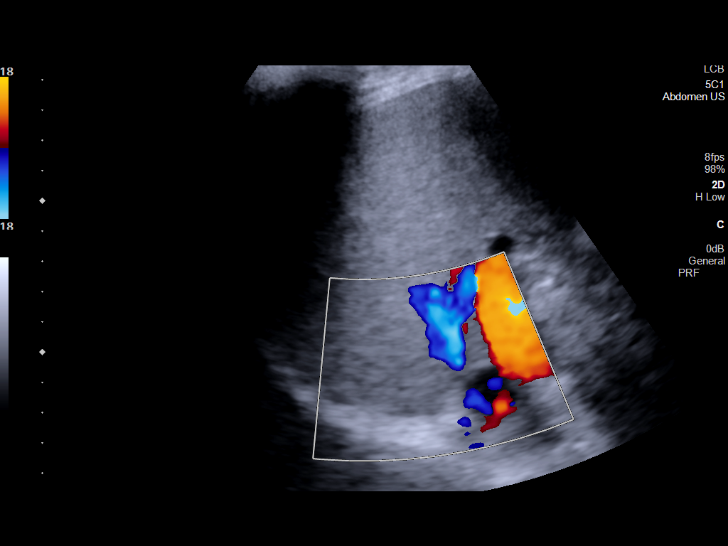
[im 39/47]
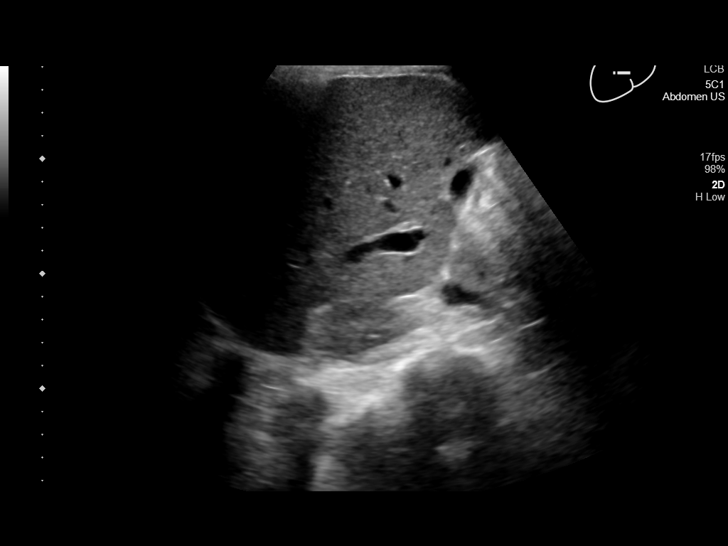
[im 43/47]
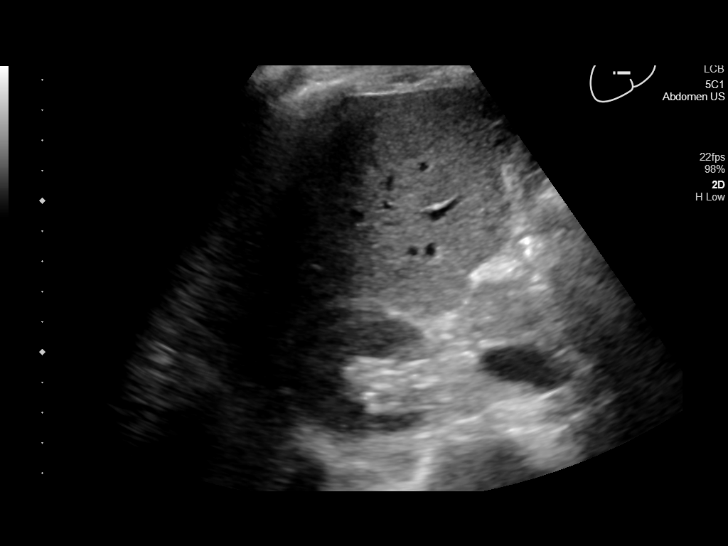
[im 47/47]
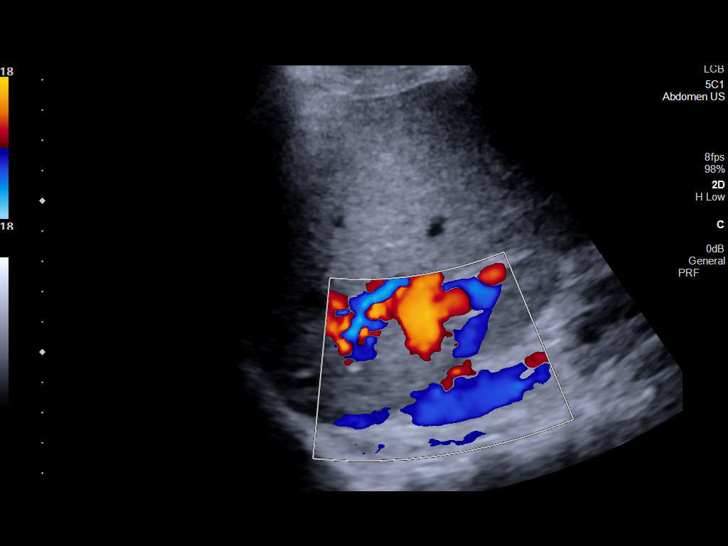

[14 of 25 positions shown; findings below may reference images not displayed]

FINDINGS: Gallbladder:

No gallstones or wall thickening visualized. There is no
pericholecystic fluid. No sonographic Murphy sign noted by
sonographer.

Common bile duct:

Diameter: 6 mm. No intrahepatic or extrahepatic biliary duct
dilatation.

Liver:

No focal lesion identified. Within normal limits in parenchymal
echogenicity. Portal vein is patent on color Doppler imaging with
normal direction of blood flow towards the liver.

Other: None.
IMPRESSION: Study within normal limits.
